# Patient Record
Sex: Female | Born: 1960 | Race: Black or African American | Hispanic: No | Marital: Married | Smoking: Former smoker
Health system: Southern US, Community
[De-identification: ages and names within clinical notes are randomized; demographics above are authoritative.]

## PROBLEM LIST (undated history)

## (undated) DIAGNOSIS — I10 Essential (primary) hypertension: Secondary | ICD-10-CM

## (undated) DIAGNOSIS — R51 Headache: Secondary | ICD-10-CM

## (undated) DIAGNOSIS — T7840XA Allergy, unspecified, initial encounter: Secondary | ICD-10-CM

## (undated) DIAGNOSIS — D649 Anemia, unspecified: Secondary | ICD-10-CM

## (undated) HISTORY — DX: Anemia, unspecified: D64.9

## (undated) HISTORY — DX: Essential (primary) hypertension: I10

## (undated) HISTORY — PX: ABDOMINAL HYSTERECTOMY: SHX81

## (undated) HISTORY — DX: Headache: R51

## (undated) HISTORY — DX: Allergy, unspecified, initial encounter: T78.40XA

---

## 1999-02-10 ENCOUNTER — Emergency Department (HOSPITAL_COMMUNITY): Admission: EM | Admit: 1999-02-10 | Discharge: 1999-02-10 | Payer: Self-pay | Admitting: *Deleted

## 1999-02-13 ENCOUNTER — Encounter (HOSPITAL_COMMUNITY): Admission: RE | Admit: 1999-02-13 | Discharge: 1999-05-14 | Payer: Self-pay | Admitting: Family Medicine

## 1999-03-04 ENCOUNTER — Ambulatory Visit (HOSPITAL_COMMUNITY): Admission: RE | Admit: 1999-03-04 | Discharge: 1999-03-04 | Payer: Self-pay | Admitting: Family Medicine

## 1999-03-04 ENCOUNTER — Encounter: Payer: Self-pay | Admitting: Family Medicine

## 2000-08-10 ENCOUNTER — Encounter: Payer: Self-pay | Admitting: Emergency Medicine

## 2000-08-10 ENCOUNTER — Emergency Department (HOSPITAL_COMMUNITY): Admission: EM | Admit: 2000-08-10 | Discharge: 2000-08-10 | Payer: Self-pay | Admitting: Emergency Medicine

## 2001-03-25 ENCOUNTER — Other Ambulatory Visit: Admission: RE | Admit: 2001-03-25 | Discharge: 2001-03-25 | Payer: Self-pay | Admitting: *Deleted

## 2003-10-25 ENCOUNTER — Ambulatory Visit (HOSPITAL_COMMUNITY): Admission: RE | Admit: 2003-10-25 | Discharge: 2003-10-25 | Payer: Self-pay | Admitting: Obstetrics

## 2003-12-03 ENCOUNTER — Encounter (INDEPENDENT_AMBULATORY_CARE_PROVIDER_SITE_OTHER): Payer: Self-pay | Admitting: Specialist

## 2003-12-03 ENCOUNTER — Inpatient Hospital Stay (HOSPITAL_COMMUNITY): Admission: RE | Admit: 2003-12-03 | Discharge: 2003-12-05 | Payer: Self-pay | Admitting: Obstetrics

## 2005-04-23 ENCOUNTER — Emergency Department (HOSPITAL_COMMUNITY): Admission: EM | Admit: 2005-04-23 | Discharge: 2005-04-23 | Payer: Self-pay | Admitting: Emergency Medicine

## 2005-05-06 ENCOUNTER — Encounter (INDEPENDENT_AMBULATORY_CARE_PROVIDER_SITE_OTHER): Payer: Self-pay | Admitting: Specialist

## 2005-05-06 ENCOUNTER — Encounter: Payer: Self-pay | Admitting: Emergency Medicine

## 2005-05-06 ENCOUNTER — Inpatient Hospital Stay (HOSPITAL_COMMUNITY): Admission: AD | Admit: 2005-05-06 | Discharge: 2005-05-11 | Payer: Self-pay | Admitting: Neurosurgery

## 2005-05-08 ENCOUNTER — Ambulatory Visit: Payer: Self-pay | Admitting: Pulmonary Disease

## 2005-10-02 ENCOUNTER — Ambulatory Visit (HOSPITAL_COMMUNITY): Admission: RE | Admit: 2005-10-02 | Discharge: 2005-10-02 | Payer: Self-pay | Admitting: Obstetrics

## 2006-07-27 LAB — HM MAMMOGRAPHY: HM Mammogram: NORMAL

## 2007-12-21 ENCOUNTER — Emergency Department (HOSPITAL_COMMUNITY): Admission: EM | Admit: 2007-12-21 | Discharge: 2007-12-21 | Payer: Self-pay | Admitting: Emergency Medicine

## 2009-06-26 ENCOUNTER — Ambulatory Visit: Payer: Self-pay | Admitting: Internal Medicine

## 2009-06-26 DIAGNOSIS — F17200 Nicotine dependence, unspecified, uncomplicated: Secondary | ICD-10-CM | POA: Insufficient documentation

## 2009-06-26 DIAGNOSIS — R519 Headache, unspecified: Secondary | ICD-10-CM | POA: Insufficient documentation

## 2009-06-26 DIAGNOSIS — I10 Essential (primary) hypertension: Secondary | ICD-10-CM | POA: Insufficient documentation

## 2009-06-26 DIAGNOSIS — R51 Headache: Secondary | ICD-10-CM

## 2009-06-26 DIAGNOSIS — D649 Anemia, unspecified: Secondary | ICD-10-CM

## 2009-06-26 LAB — CONVERTED CEMR LAB
ALT: 13 units/L (ref 0–35)
AST: 16 units/L (ref 0–37)
Albumin: 4.1 g/dL (ref 3.5–5.2)
Alkaline Phosphatase: 82 units/L (ref 39–117)
BUN: 7 mg/dL (ref 6–23)
Bilirubin, Direct: 0 mg/dL (ref 0.0–0.3)
CO2: 32 meq/L (ref 19–32)
HCT: 43.2 % (ref 36.0–46.0)
Hemoglobin: 14.8 g/dL (ref 12.0–15.0)
Lipase: 25 units/L (ref 11.0–59.0)
MCHC: 34.2 g/dL (ref 30.0–36.0)
MCV: 93.9 fL (ref 78.0–100.0)
RBC: 4.6 M/uL (ref 3.87–5.11)
RDW: 13.1 % (ref 11.5–14.6)
TSH: 1.96 microintl units/mL (ref 0.35–5.50)
Total Protein, Urine: NEGATIVE mg/dL
WBC: 6.1 10*3/uL (ref 4.5–10.5)

## 2009-07-01 ENCOUNTER — Telehealth: Payer: Self-pay | Admitting: Internal Medicine

## 2009-09-11 ENCOUNTER — Telehealth: Payer: Self-pay | Admitting: Internal Medicine

## 2009-10-09 ENCOUNTER — Ambulatory Visit: Payer: Self-pay | Admitting: Internal Medicine

## 2009-10-09 DIAGNOSIS — J309 Allergic rhinitis, unspecified: Secondary | ICD-10-CM

## 2009-12-12 IMAGING — CR DG ABDOMEN ACUTE W/ 1V CHEST
4 series · 4 of 4 positions shown · non-contrast
Comparison: None

CLINICAL DATA: Generalized abdominal pain, constipation and
diarrhea, smoking history

ACUTE ABDOMEN SERIES (ABDOMEN 2 VIEW & CHEST 1 VIEW)

[view not recorded (1 of 4)]
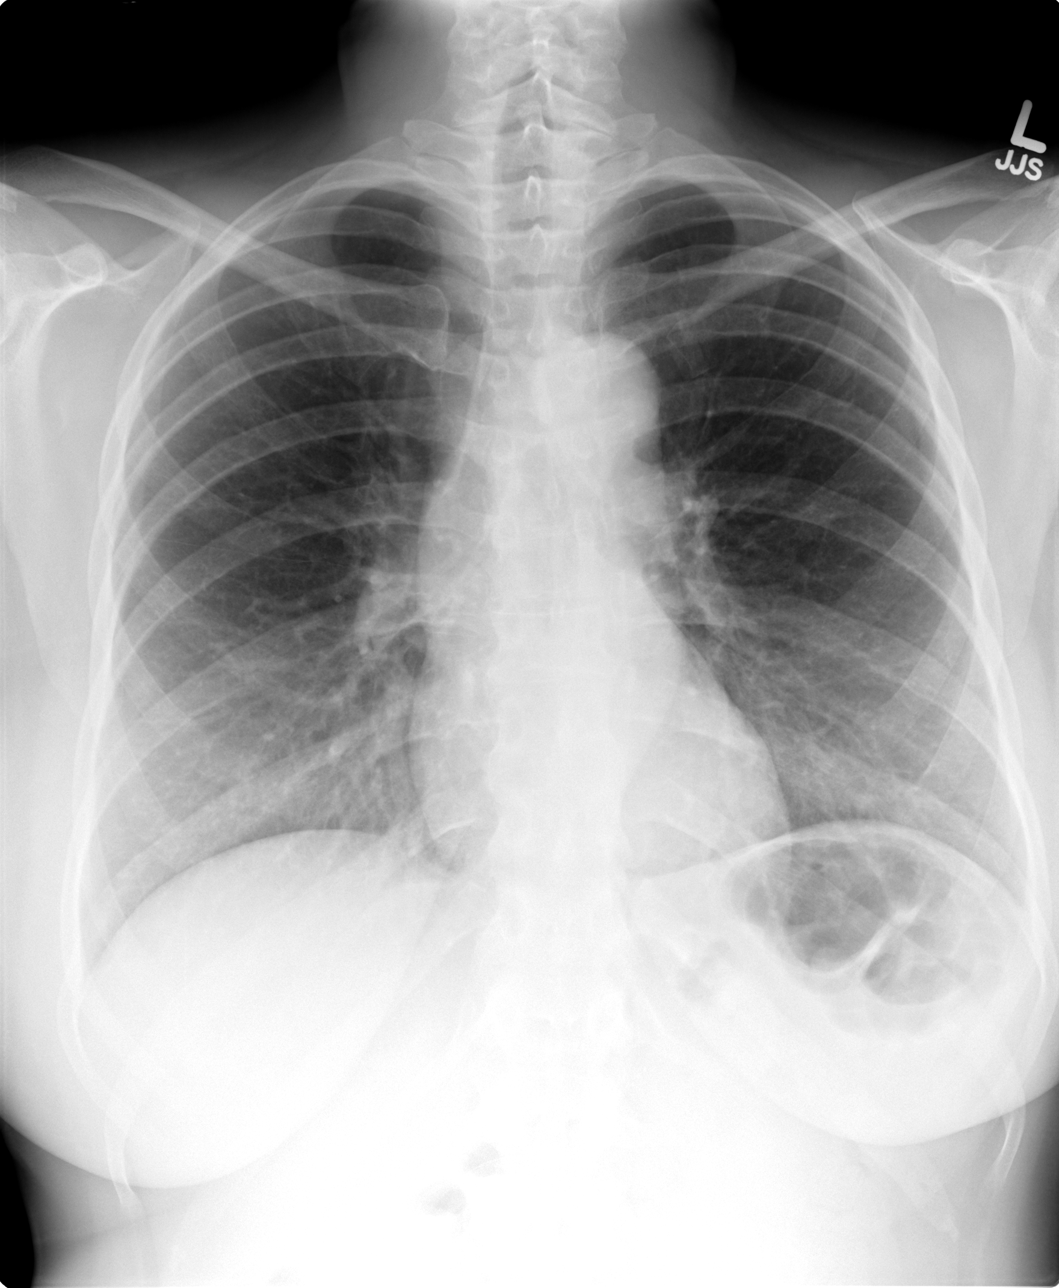

[view not recorded (2 of 4)]
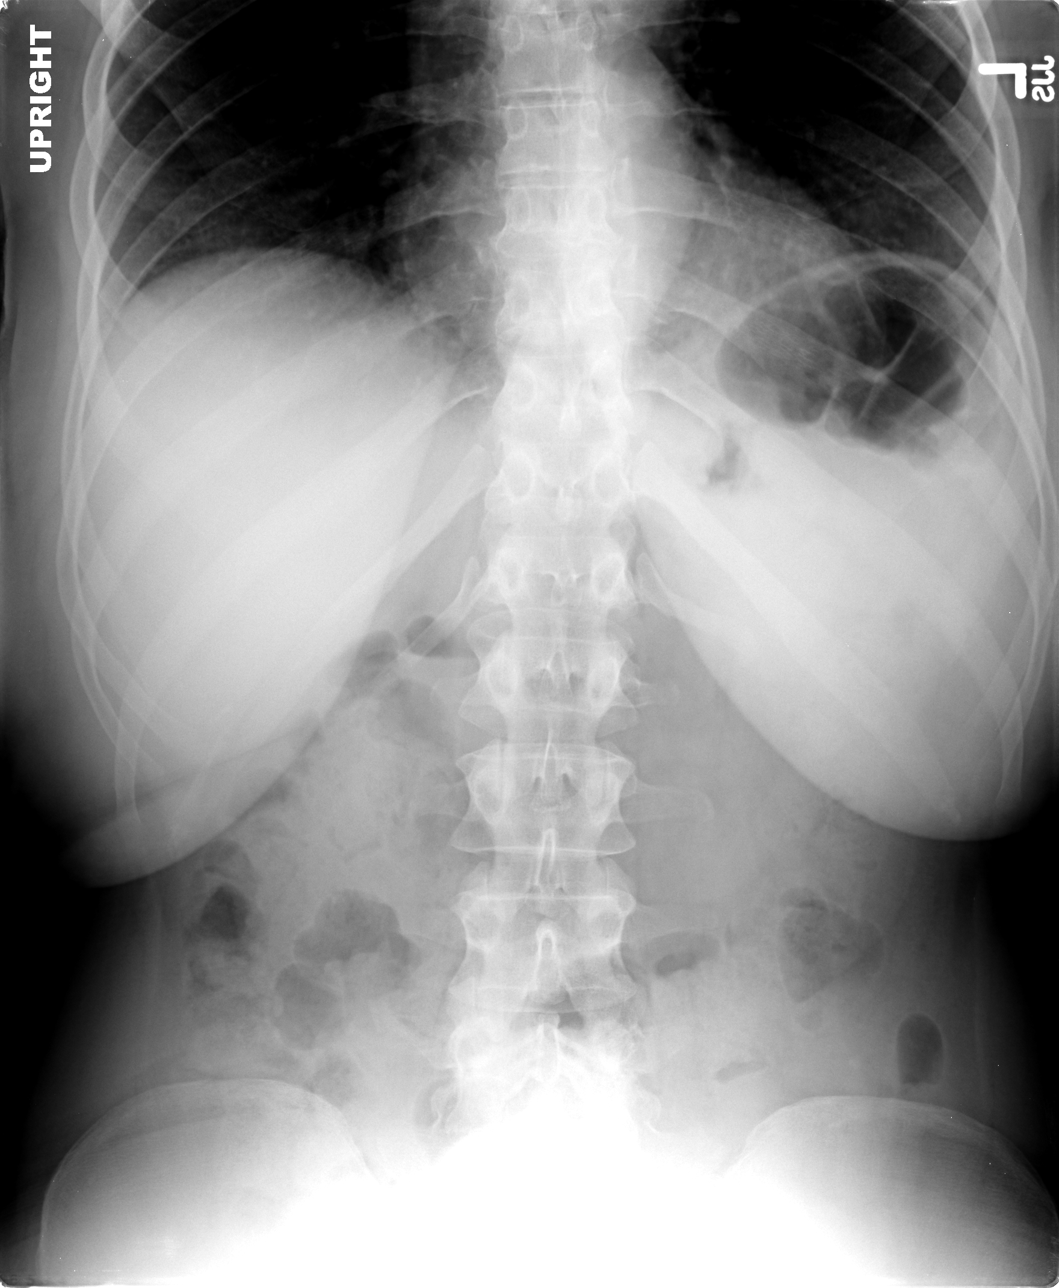

[view not recorded (3 of 4)]
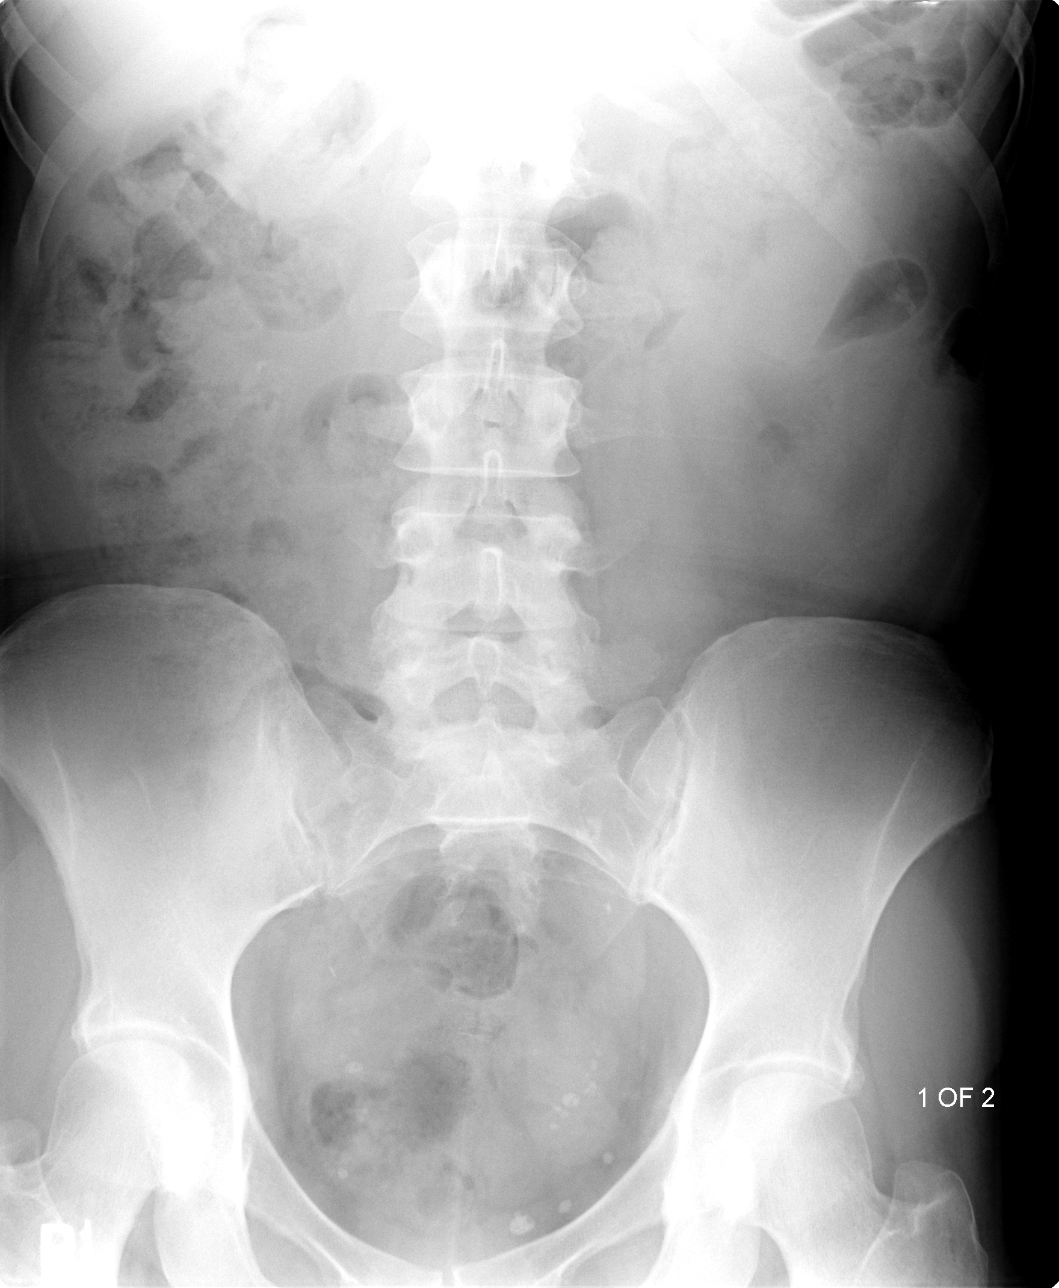

[view not recorded (4 of 4)]
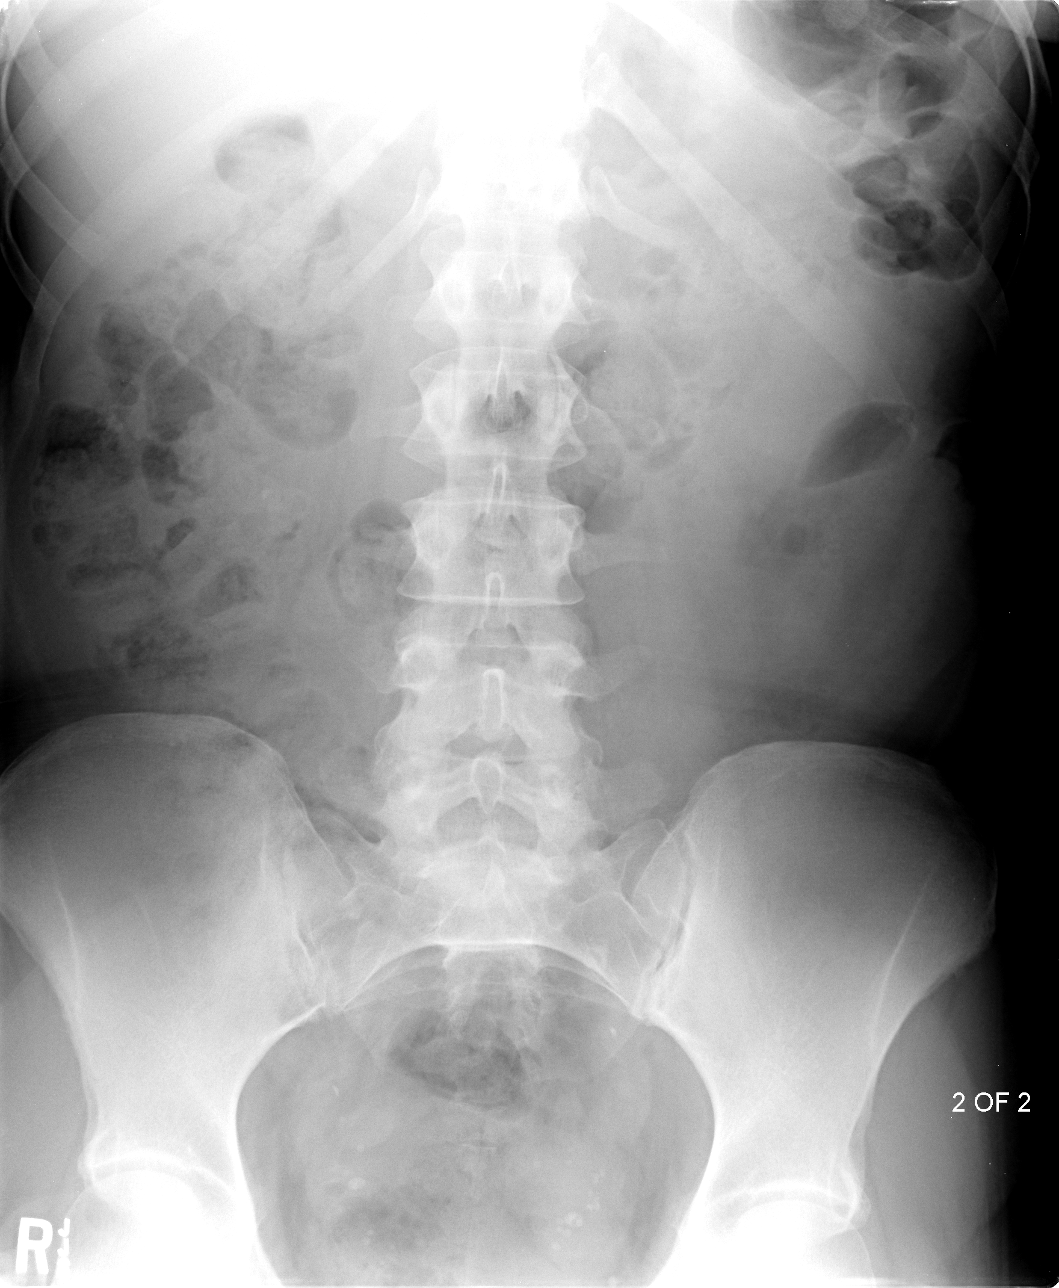

[4 of 4 positions shown; findings below may reference images not displayed]

FINDINGS: The lungs are clear.  The heart is within normal limits
in size.  No adenopathy is seen.  No bony abnormalities noted.

Supine and erect views of the abdomen show a nonspecific bowel gas
pattern.  No obstruction is seen and no free air is noted.  A small
right lower pole renal calculus cannot be excluded  There are
calcifications in the bony pelvis most consistent with phleboliths.
IMPRESSION: No active lung disease.  No bowel obstruction or free air. Cannot
exclude small right renal calculus.

## 2010-04-23 ENCOUNTER — Ambulatory Visit: Payer: Self-pay | Admitting: Internal Medicine

## 2010-04-23 ENCOUNTER — Encounter: Payer: Self-pay | Admitting: Internal Medicine

## 2010-04-23 DIAGNOSIS — R079 Chest pain, unspecified: Secondary | ICD-10-CM

## 2010-04-23 LAB — CONVERTED CEMR LAB
AST: 15 units/L (ref 0–37)
Alkaline Phosphatase: 80 units/L (ref 39–117)
Bilirubin, Direct: 0.2 mg/dL (ref 0.0–0.3)
CK-MB: 0.6 ng/mL (ref 0.3–4.0)
Calcium: 9 mg/dL (ref 8.4–10.5)
Eosinophils Absolute: 0.1 10*3/uL (ref 0.0–0.7)
Eosinophils Relative: 2.2 % (ref 0.0–5.0)
Folate: 7.4 ng/mL
Glucose, Bld: 88 mg/dL (ref 70–99)
HCT: 43.4 % (ref 36.0–46.0)
Hemoglobin: 14.9 g/dL (ref 12.0–15.0)
Iron: 93 ug/dL (ref 42–145)
Lymphocytes Relative: 38.7 % (ref 12.0–46.0)
Neutro Abs: 3.3 10*3/uL (ref 1.4–7.7)
Neutrophils Relative %: 52.5 % (ref 43.0–77.0)
Potassium: 3.9 meq/L (ref 3.5–5.1)
RBC: 4.69 M/uL (ref 3.87–5.11)
Relative Index: 0.9 (ref 0.0–2.5)
Sodium: 140 meq/L (ref 135–145)
TSH: 1.34 microintl units/mL (ref 0.35–5.50)

## 2010-04-28 ENCOUNTER — Telehealth: Payer: Self-pay | Admitting: Internal Medicine

## 2010-08-07 ENCOUNTER — Encounter: Payer: Self-pay | Admitting: Internal Medicine

## 2010-08-16 ENCOUNTER — Encounter: Payer: Self-pay | Admitting: Family Medicine

## 2010-08-26 NOTE — Assessment & Plan Note (Signed)
Summary: FEELING WEAK AND EXHAUSTED--D/T--JOB--STC   Vital Signs:  Patient profile:   50 year old female Height:      69 inches Weight:      178 pounds BMI:     26.38 O2 Sat:      98 % on Room air Temp:     98.6 degrees F oral Pulse rate:   84 / minute Pulse rhythm:   regular Resp:     16 per minute BP sitting:   200 / 130  (left arm) Cuff size:   regular  Vitals Entered By: Lanier Prude, Beverly Gust) (April 23, 2010 3:32 PM)  Nutrition Counseling: Patient's BMI is greater than 25 and therefore counseled on weight management options.  O2 Flow:  Room air CC: f/u c/o fatigue X 2 wks Is Patient Diabetic? No Comments pt needs Rf on Coreg and Tribenzor.  She has been out X 1 weeks   Primary Care Provider:  Etta Grandchild MD  CC:  f/u c/o fatigue X 2 wks.  History of Present Illness:  Hypertension Follow-Up      This is a 50 year old woman who presents for Hypertension follow-up.  The patient reports headaches, but denies lightheadedness, urinary frequency, edema, rash, and fatigue.  Associated symptoms include chest pain.  The patient denies the following associated symptoms: chest pressure, exercise intolerance, dyspnea, palpitations, syncope, leg edema, and pedal edema.  Compliance with medications (by patient report) has been poor.  The patient reports that dietary compliance has been fair.  The patient reports exercising occasionally.  Adjunctive measures currently used by the patient include salt restriction and relaxation.         The patient also complains of Chest pain.  The symptoms began 2 weeks ago.  On a scale of 1 to 10, the intensity is described as a 3.  The patient reports resting chest pain, but denies exertional chest pain, nausea, vomiting, diaphoresis, shortness of breath, palpitations, dizziness, light headedness, syncope, and indigestion.  The pain is described as intermittent and sharp.  The pain is located in the substernal area and the pain does not  radiate.  Episodes of chest pain last 1-2 minutes.  The pain is brought on or made worse by coughing.    Preventive Screening-Counseling & Management  Alcohol-Tobacco     Alcohol drinks/day: <1     Alcohol type: wine     >5/day in last 3 mos: no     Alcohol Counseling: not indicated; use of alcohol is not excessive or problematic     Feels need to cut down: no     Feels annoyed by complaints: no     Feels guilty re: drinking: no     Needs 'eye opener' in am: no     Smoking Status: current     Smoking Cessation Counseling: yes     Smoke Cessation Stage: precontemplative     Packs/Day: 0.75     Year Started: 1990     Pack years: 15     Tobacco Counseling: to quit use of tobacco products  Current Medications (verified): 1)  Coreg 12.5 Mg Tabs (Carvedilol) .... Take 1 Tablet By Mouth Two Times A Day 2)  Miralax  Pack (Polyethylene Glycol 3350) .... One in 8 Ounces of Juice Once Daily As Needed For Constipation 3)  Tribenzor 40-5-12.5 Mg Tabs (Olmesartan-Amlodipine-Hctz) .... One By Mouth Once Daily For High Blood Pressure 4)  Veramyst 27.5 Mcg/spray Susp (Fluticasone Furoate) .... 2 Puffs Each Nostril  Once Daily  Allergies (verified): 1)  ! Norvasc  Past History:  Past Medical History: Last updated: 10/09/2009 Headache-pit. hemorrhage in 2008 Hypertension-followed by [facility] Anemia-NOS Allergic rhinitis  Past Surgical History: Last updated: 06/26/2009 Hysterectomy  Family History: Last updated: 06/26/2009 Family History Hypertension  Social History: Last updated: 06/26/2009 Occupation: Scientist, product/process development support specialist Married Current Smoker Alcohol use-yes Drug use-no Regular exercise-no  Risk Factors: Alcohol Use: <1 (04/23/2010) >5 drinks/d w/in last 3 months: no (04/23/2010) Exercise: no (10/09/2009)  Risk Factors: Smoking Status: current (04/23/2010) Packs/Day: 0.75 (04/23/2010)  Family History: Reviewed history from 06/26/2009 and no changes  required. Family History Hypertension  Social History: Reviewed history from 06/26/2009 and no changes required. Occupation: Investment banker, operational Married Current Smoker Alcohol use-yes Drug use-no Regular exercise-no  Review of Systems       The patient complains of weight gain and chest pain.  The patient denies anorexia, fever, weight loss, decreased hearing, hoarseness, syncope, dyspnea on exertion, peripheral edema, prolonged cough, hemoptysis, abdominal pain, hematuria, suspicious skin lesions, transient blindness, difficulty walking, depression, enlarged lymph nodes, and angioedema.    Physical Exam  General:  alert, well-developed, well-nourished, well-hydrated, appropriate dress, normal appearance, healthy-appearing, cooperative to examination, and good hygiene.   Head:  normocephalic, atraumatic, no abnormalities observed, and no abnormalities palpated.   Eyes:  vision grossly intact, pupils equal, pupils round, pupils reactive to light, and a-v nicking.   Mouth:  Oral mucosa and oropharynx without lesions or exudates.  Teeth in good repair. Neck:  supple, full ROM, no masses, no thyromegaly, no thyroid nodules or tenderness, no JVD, normal carotid upstroke, no carotid bruits, no cervical lymphadenopathy, and no neck tenderness.   Lungs:  normal respiratory effort, no intercostal retractions, no accessory muscle use, normal breath sounds, no dullness, no fremitus, no crackles, and no wheezes.   Heart:  normal rate, regular rhythm, no murmur, no gallop, no rub, and no JVD.   Abdomen:  soft, non-tender, normal bowel sounds, no distention, no masses, no guarding, no rigidity, no rebound tenderness, no abdominal hernia, no inguinal hernia, no hepatomegaly, and no splenomegaly.   Msk:  normal ROM, no joint tenderness, no joint swelling, no joint warmth, no redness over joints, no joint deformities, no joint instability, no crepitation, and no muscle atrophy.   Pulses:  R and L  carotid,radial,femoral,dorsalis pedis and posterior tibial pulses are full and equal bilaterally Extremities:  No clubbing, cyanosis, edema, or deformity noted with normal full range of motion of all joints.   Neurologic:  No cranial nerve deficits noted. Station and gait are normal. Plantar reflexes are down-going bilaterally. DTRs are symmetrical throughout. Sensory, motor and coordinative functions appear intact. Skin:  turgor normal, color normal, no rashes, no suspicious lesions, no ecchymoses, no petechiae, no purpura, no ulcerations, and no edema.   Cervical Nodes:  no anterior cervical adenopathy and no posterior cervical adenopathy.   Axillary Nodes:  no R axillary adenopathy and no L axillary adenopathy.   Inguinal Nodes:  no R inguinal adenopathy and no L inguinal adenopathy.   Psych:  Cognition and judgment appear intact. Alert and cooperative with normal attention span and concentration. No apparent delusions, illusions, hallucinations Additional Exam:  EKG shows a slight loss of voltage in V1 and V2 but there are no Q waves and acute ST/T wave changes   Impression & Recommendations:  Problem # 1:  CHEST PAIN (ICD-786.50) Assessment New this is atypical and her EKG is benign, I will restart her BP  meds and look at labs Orders: Venipuncture (60454) TLB-Cardiac Panel ([id number]-GNFA) TLB-BMP (Basic Metabolic Panel-BMET) (80048-METABOL) TLB-CBC Platelet - w/Differential (85025-CBCD) TLB-Hepatic/Liver Function Pnl (80076-HEPATIC) TLB-TSH (Thyroid Stimulating Hormone) (84443-TSH) EKG w/ Interpretation (93000)  Problem # 2:  TOBACCO USE (ICD-305.1) Assessment: Unchanged  Orders: Tobacco use cessation intermediate 3-10 minutes (21308)  Encouraged smoking cessation and discussed different methods for smoking cessation.   Problem # 3:  HYPERTENSION (ICD-401.9) Assessment: Deteriorated  Her updated medication list for this problem includes:    Coreg 12.5 Mg Tabs (Carvedilol)  .Marland Kitchen... Take 1 tablet by mouth two times a day    Tribenzor 40-5-12.5 Mg Tabs (Olmesartan-amlodipine-hctz) ..... One by mouth once daily for high blood pressure  Orders: Venipuncture (65784) TLB-Cardiac Panel ([id number]) TLB-BMP (Basic Metabolic Panel-BMET) (80048-METABOL) TLB-CBC Platelet - w/Differential (85025-CBCD) TLB-Hepatic/Liver Function Pnl (80076-HEPATIC) TLB-TSH (Thyroid Stimulating Hormone) (84443-TSH) Tobacco use cessation intermediate 3-10 minutes (99406)  BP today: 200/130 Prior BP: 150/100 (10/09/2009)  Prior 10 Yr Risk Heart Disease: Not enough information (06/26/2009)  Labs Reviewed: K+: 3.5 (06/26/2009) Creat: : 0.7 (06/26/2009)     Problem # 4:  ANEMIA-NOS (ICD-285.9) Assessment: Unchanged  Orders: Venipuncture (01027) TLB-Cardiac Panel ([id number]) TLB-BMP (Basic Metabolic Panel-BMET) (80048-METABOL) TLB-CBC Platelet - w/Differential (85025-CBCD) TLB-Hepatic/Liver Function Pnl (80076-HEPATIC) TLB-TSH (Thyroid Stimulating Hormone) (84443-TSH) TLB-B12 + Folate Pnl (95638_75643-P29/JJO) TLB-IBC Pnl (Iron/FE;Transferrin) (83550-IBC)  Complete Medication List: 1)  Coreg 12.5 Mg Tabs (Carvedilol) .... Take 1 tablet by mouth two times a day 2)  Miralax Pack (Polyethylene glycol 3350) .... One in 8 ounces of juice once daily as needed for constipation 3)  Tribenzor 40-5-12.5 Mg Tabs (Olmesartan-amlodipine-hctz) .... One by mouth once daily for high blood pressure 4)  Veramyst 27.5 Mcg/spray Susp (Fluticasone furoate) .... 2 puffs each nostril once daily  Patient Instructions: 1)  Please schedule a follow-up appointment in 2 weeks. 2)  Tobacco is very bad for your health and your loved ones! You Should stop smoking!. 3)  Stop Smoking Tips: Choose a Quit date. Cut down before the Quit date. decide what you will do as a substitute when you feel the urge to smoke(gum,toothpick,exercise). 4)  It is important that you exercise regularly at least 20  minutes 5 times a week. If you develop chest pain, have severe difficulty breathing, or feel very tired , stop exercising immediately and seek medical attention. 5)  You need to lose weight. Consider a lower calorie diet and regular exercise.  6)  Check your Blood Pressure regularly. If it is above 140/90: you should make an appointment. Prescriptions: TRIBENZOR 40-5-12.5 MG TABS (OLMESARTAN-AMLODIPINE-HCTZ) One by mouth once daily for high blood pressure  #112 x 0   Entered and Authorized by:   Etta Grandchild MD   Signed by:   Etta Grandchild MD on 04/23/2010   Method used:   Samples Given   RxID:   [id number]

## 2010-08-26 NOTE — Progress Notes (Signed)
Summary: Benicar PA  Phone Note From Pharmacy   Summary of Call: Rec'd PA request for Benicar. The preferred alternatives are Losartan, Diovan, and Micardis. Please advise. Initial call taken by: Lucious Groves,  September 11, 2009 9:38 AM  Follow-up for Phone Call        done Follow-up by: Etta Grandchild MD,  September 11, 2009 9:41 AM  Additional Follow-up for Phone Call Additional follow up Details #1::        Patient notified and states that Micardis previously did not help her blood pressure. Additional Follow-up by: Lucious Groves,  September 11, 2009 10:20 AM    New/Updated Medications: DIOVAN HCT 160-12.5 MG TABS (VALSARTAN-HYDROCHLOROTHIAZIDE) one by mouth once daily for high blood pressure Prescriptions: DIOVAN HCT 160-12.5 MG TABS (VALSARTAN-HYDROCHLOROTHIAZIDE) one by mouth once daily for high blood pressure  #30 x 11   Entered and Authorized by:   Etta Grandchild MD   Signed by:   Etta Grandchild MD on 09/11/2009   Method used:   Electronically to        [facility]  [address] [id number] (retail)       [address]       [place], [place]  [address]       Ph: [phone] or [phone]       Fax: [phone]   RxID:   [id number]

## 2010-08-26 NOTE — Progress Notes (Signed)
Summary: RESULTS  Phone Note Call from Patient Call back at [phone]   Summary of Call: Patient is requesting results of labs. She continues to feel very fatigued, what does MD advise?  Initial call taken by: Lamar Sprinkles, CMA,  April 28, 2010 4:17 PM  Follow-up for Phone Call        labs are normal, follow up Follow-up by: Etta Grandchild MD,  April 28, 2010 4:19 PM  Additional Follow-up for Phone Call Additional follow up Details #1::        left mess to call office back..............Marland KitchenLamar Sprinkles, CMA  April 28, 2010 5:17 PM     Additional Follow-up for Phone Call Additional follow up Details #2::    lm for pt to call back Follow-up by: Ami Bullins CMA,  April 30, 2010 8:13 AM  Additional Follow-up for Phone Call Additional follow up Details #3:: Details for Additional Follow-up Action Taken: No answer, no vm...................Marland KitchenLamar Sprinkles, CMA  April 30, 2010 5:40 PM   Pt states that feelings of fatigue have been improving since she called. Pt says she will continue to monitor improvemnet and if needed she will call back to sch f/u appt with TLJ Additional Follow-up by: Margaret Pyle, CMA,  May 01, 2010 9:49 AM

## 2010-08-26 NOTE — Progress Notes (Signed)
Summary: Diovan alternative  Phone Note Call from Patient   Summary of Call: Patient was told the preferred alternatives for her ins are Benazepril, Lisinopril, Quinapril, and Losartan. Which one would you like to use since the patient cannot get Diovan. Please advise. Initial call taken by: Lucious Groves,  September 11, 2009 11:47 AM  Follow-up for Phone Call        done Follow-up by: Etta Grandchild MD,  September 11, 2009 12:44 PM    New/Updated Medications: LOSARTAN POTASSIUM-HCTZ 100-12.5 MG TABS (LOSARTAN POTASSIUM-HCTZ) one by mouth once daily for high blood pressure Prescriptions: LOSARTAN POTASSIUM-HCTZ 100-12.5 MG TABS (LOSARTAN POTASSIUM-HCTZ) one by mouth once daily for high blood pressure  #30 x 11   Entered and Authorized by:   Etta Grandchild MD   Signed by:   Etta Grandchild MD on 09/11/2009   Method used:   [facility] to        [facility]  [address] [id number]       [address]       [place], [place]  [address]       Ph: [phone] or [phone]       Fax: [phone]   RxID:   [id number]   Appended Document: Diovan alternative Patient notified.

## 2010-08-26 NOTE — Assessment & Plan Note (Signed)
Summary: f/u on blood pressure/#/Cd   Vital Signs:  Patient profile:   50 year old female Height:      69 inches Weight:      176 pounds BMI:     26.08 O2 Sat:      98 % on Room air Temp:     99.2 degrees F oral Pulse rate:   77 / minute Pulse rhythm:   regular Resp:     16 per minute BP sitting:   150 / 100  (left arm) Cuff size:   large  Vitals Entered By: Rock Nephew CMA (October 09, 2009 2:28 PM)  Nutrition Counseling: Patient's BMI is greater than 25 and therefore counseled on weight management options.  O2 Flow:  Room air CC: follow up BP/ sinus pressure and headache, Hypertension Management Is Patient Diabetic? No   Primary Care Provider:  Etta Grandchild MD  CC:  follow up BP/ sinus pressure and headache and Hypertension Management.  History of Present Illness: She returns for f/up and she requests a new med for htn. Her insurance co. made her switch to losartan/hctz and it does not control her BP.  Also, she has seasonal allergies.  Hypertension History:      She denies headache, chest pain, palpitations, dyspnea with exertion, orthopnea, PND, peripheral edema, visual symptoms, neurologic problems, syncope, and side effects from treatment.  She notes no problems with any antihypertensive medication side effects.        Positive major cardiovascular risk factors include hypertension and current tobacco user.  Negative major cardiovascular risk factors include female age less than 79 years old, no history of diabetes or hyperlipidemia, and negative family history for ischemic heart disease.        Further assessment for target organ damage reveals no history of ASHD, cardiac end-organ damage (CHF/LVH), stroke/TIA, peripheral vascular disease, renal insufficiency, or hypertensive retinopathy.     Preventive Screening-Counseling & Management  Alcohol-Tobacco     Alcohol drinks/day: <1     Alcohol type: wine     >5/day in last 3 mos: no     Alcohol Counseling: not  indicated; use of alcohol is not excessive or problematic     Feels need to cut down: no     Feels annoyed by complaints: no     Feels guilty re: drinking: no     Needs 'eye opener' in am: no     Smoking Status: current     Smoking Cessation Counseling: yes     Smoke Cessation Stage: precontemplative     Packs/Day: 0.75     Year Started: 1990     Pack years: 52  Caffeine-Diet-Exercise     Does Patient Exercise: no  Hep-HIV-STD-Contraception     Hepatitis Risk: no risk noted     HIV Risk: no risk noted     STD Risk: no risk noted  Clinical Review Panels:  CBC   WBC:  6.1 (06/26/2009)   RBC:  4.60 (06/26/2009)   Hgb:  14.8 (06/26/2009)   Hct:  43.2 (06/26/2009)   Platelets:  240.0 (06/26/2009)   MCV  93.9 (06/26/2009)   MCHC  34.2 (06/26/2009)   RDW  13.1 (06/26/2009)   PMN:  56.3 (06/26/2009)   Lymphs:  36.2 (06/26/2009)   Monos:  4.5 (06/26/2009)   Eosinophils:  2.0 (06/26/2009)   Basophil:  1.0 (06/26/2009)  Complete Metabolic Panel   Glucose:  98 (06/26/2009)   Sodium:  142 (06/26/2009)   Potassium:  3.5 (  06/26/2009)   Chloride:  102 (06/26/2009)   CO2:  32 (06/26/2009)   BUN:  7 (06/26/2009)   Creatinine:  0.7 (06/26/2009)   Albumin:  4.1 (06/26/2009)   Total Protein:  7.8 (06/26/2009)   Calcium:  9.4 (06/26/2009)   Total Bili:  0.7 (06/26/2009)   Alk Phos:  82 (06/26/2009)   SGPT (ALT):  13 (06/26/2009)   SGOT (AST):  16 (06/26/2009)   Medications Prior to Update: 1)  Coreg 12.5 Mg Tabs (Carvedilol) .... Take 1 Tablet By Mouth Two Times A Day 2)  Losartan Potassium-Hctz 100-12.5 Mg Tabs (Losartan Potassium-Hctz) .... One By Mouth Once Daily For High Blood Pressure 3)  Miralax  Pack (Polyethylene Glycol 3350) .... One in 8 Ounces of Juice Once Daily As Needed For Constipation  Current Medications (verified): 1)  Coreg 12.5 Mg Tabs (Carvedilol) .... Take 1 Tablet By Mouth Two Times A Day 2)  Miralax  Pack (Polyethylene Glycol 3350) .... One in 8 Ounces  of Juice Once Daily As Needed For Constipation 3)  Tribenzor 40-5-12.5 Mg Tabs (Olmesartan-Amlodipine-Hctz) .... One By Mouth Once Daily For High Blood Pressure 4)  Veramyst 27.5 Mcg/spray Susp (Fluticasone Furoate) .... 2 Puffs Each Nostril Once Daily  Allergies (verified): 1)  ! Norvasc  Past History:  Past Medical History: Headache-pit. hemorrhage in 2008 Hypertension-followed by [facility] Anemia-NOS Allergic rhinitis  Past Surgical History: Reviewed history from 06/26/2009 and no changes required. Hysterectomy  Family History: Reviewed history from 06/26/2009 and no changes required. Family History Hypertension  Social History: Reviewed history from 06/26/2009 and no changes required. Occupation: Investment banker, operational Married Current Smoker Alcohol use-yes Drug use-no Regular exercise-no  Review of Systems ENT:  Complains of nasal congestion and nosebleeds; denies decreased hearing, difficulty swallowing, ear discharge, earache, hoarseness, postnasal drainage, ringing in ears, sinus pressure, and sore throat.  Physical Exam  General:  alert, well-developed, well-nourished, well-hydrated, appropriate dress, normal appearance, healthy-appearing, cooperative to examination, and good hygiene.   Head:  normocephalic, atraumatic, and no abnormalities observed.   Eyes:  vision grossly intact, pupils equal, and pupils round.  a-v nicking.   Ears:  R ear normal and L ear normal.   Nose:  no external deformity, no airflow obstruction, no intranasal foreign body, no nasal polyps, no nasal mucosal lesions, no mucosal friability, no active bleeding or clots, no sinus percussion tenderness, no septum abnormalities, nasal dischargemucosal pallor, and mucosal edema.   Mouth:  Oral mucosa and oropharynx without lesions or exudates.  Teeth in good repair. Neck:  supple, full ROM, no masses, no thyroid nodules or tenderness, no JVD, no cervical lymphadenopathy, and no neck  tenderness.   Lungs:  normal respiratory effort, no intercostal retractions, no accessory muscle use, normal breath sounds, no dullness, no fremitus, no crackles, and no wheezes.   Heart:  normal rate, regular rhythm, no murmur, no gallop, no rub, and no JVD.   Abdomen:  soft, non-tender, no distention, no masses, no guarding, no rigidity, no rebound tenderness, no abdominal hernia, no inguinal hernia, no hepatomegaly, no splenomegaly, abdominal scar(s), and bowel sounds hypoactive.   Msk:  normal ROM, no joint tenderness, no joint swelling, no joint warmth, and no redness over joints.   Pulses:  R and L carotid,radial,femoral,dorsalis pedis and posterior tibial pulses are full and equal bilaterally Extremities:  No clubbing, cyanosis, edema, or deformity noted with normal full range of motion of all joints.   Neurologic:  No cranial nerve deficits noted. Station and gait are  normal. Plantar reflexes are down-going bilaterally. DTRs are symmetrical throughout. Sensory, motor and coordinative functions appear intact. Skin:  turgor normal, color normal, no rashes, no suspicious lesions, no ecchymoses, no petechiae, no purpura, no ulcerations, and no edema.   Cervical Nodes:  no anterior cervical adenopathy and no posterior cervical adenopathy.   Psych:  Cognition and judgment appear intact. Alert and cooperative with normal attention span and concentration. No apparent delusions, illusions, hallucinations   Impression & Recommendations:  Problem # 1:  ALLERGIC RHINITIS (ICD-477.9) Assessment New  Her updated medication list for this problem includes:    Veramyst 27.5 Mcg/spray Susp (Fluticasone furoate) .Marland Kitchen... 2 puffs each nostril once daily  Discussed use of allergy medications and environmental measures.   Orders: Tobacco use cessation intermediate 3-10 minutes (16109)  Problem # 2:  HYPERTENSION (ICD-401.9) Assessment: Deteriorated  The following medications were removed from the  medication list:    Losartan Potassium-hctz 100-12.5 Mg Tabs (Losartan potassium-hctz) ..... One by mouth once daily for high blood pressure Her updated medication list for this problem includes:    Coreg 12.5 Mg Tabs (Carvedilol) .Marland Kitchen... Take 1 tablet by mouth two times a day    Tribenzor 40-5-12.5 Mg Tabs (Olmesartan-amlodipine-hctz) ..... One by mouth once daily for high blood pressure  BP today: 150/100 Prior BP: 132/90 (06/26/2009)  Prior 10 Yr Risk Heart Disease: Not enough information (06/26/2009)  Labs Reviewed: K+: 3.5 (06/26/2009) Creat: : 0.7 (06/26/2009)     Orders: Tobacco use cessation intermediate 3-10 minutes (60454)  Problem # 3:  TOBACCO USE (ICD-305.1) Assessment: Unchanged  Encouraged smoking cessation and discussed different methods for smoking cessation.   Orders: Tobacco use cessation intermediate 3-10 minutes (99406)  Problem # 4:  HEADACHE (ICD-784.0) Assessment: Improved  Her updated medication list for this problem includes:    Coreg 12.5 Mg Tabs (Carvedilol) .Marland Kitchen... Take 1 tablet by mouth two times a day  Orders: Tobacco use cessation intermediate 3-10 minutes (99406)  Complete Medication List: 1)  Coreg 12.5 Mg Tabs (Carvedilol) .... Take 1 tablet by mouth two times a day 2)  Miralax Pack (Polyethylene glycol 3350) .... One in 8 ounces of juice once daily as needed for constipation 3)  Tribenzor 40-5-12.5 Mg Tabs (Olmesartan-amlodipine-hctz) .... One by mouth once daily for high blood pressure 4)  Veramyst 27.5 Mcg/spray Susp (Fluticasone furoate) .... 2 puffs each nostril once daily  Hypertension Assessment/Plan:      The patient's hypertensive risk group is category B: At least one risk factor (excluding diabetes) with no target organ damage.  Today's blood pressure is 150/100.  Her blood pressure goal is < 140/90.  Patient Instructions: 1)  Please schedule a follow-up appointment in 1 month. 2)  It is important that you exercise regularly at  least 20 minutes 5 times a week. If you develop chest pain, have severe difficulty breathing, or feel very tired , stop exercising immediately and seek medical attention. 3)  Check your Blood Pressure regularly. If it is above 140/90: you should make an appointment. 4)  Tobacco is very bad for your health and your loved ones! You Should stop smoking!. 5)  Stop Smoking Tips: Choose a Quit date. Cut down before the Quit date. decide what you will do as a substitute when you feel the urge to smoke(gum,toothpick,exercise). Prescriptions: VERAMYST 27.5 MCG/SPRAY SUSP (FLUTICASONE FUROATE) 2 puffs each nostril once daily  #5 inhs x 0   Entered and Authorized by:   Etta Grandchild MD   Signed  by:   Etta Grandchild MD on 10/09/2009   Method used:   Samples Given   RxID:   [id number] TRIBENZOR 40-5-12.5 MG TABS (OLMESARTAN-AMLODIPINE-HCTZ) One by mouth once daily for high blood pressure  #140 x 0   Entered and Authorized by:   Etta Grandchild MD   Signed by:   Etta Grandchild MD on 10/09/2009   Method used:   Samples Given   RxID:   [id number]

## 2010-08-28 NOTE — Letter (Signed)
Summary: [facility]   Imported By: Lester [place] 08/18/2010 07:39:17  _____________________________________________________________________  External Attachment:    Type:   Image     Comment:   External Document

## 2010-12-12 NOTE — H&P (Signed)
NAMEMarland Kitchen  GISSELE, NARDUCCI                ACCOUNT NO.:  [id number]   MEDICAL RECORD NO.:  [id number]           PATIENT TYPE:   LOCATION:                                 FACILITY:   PHYSICIAN:  Danae Orleans. Venetia Maxon, M.D.       DATE OF BIRTH:   DATE OF ADMISSION:  DATE OF DISCHARGE:                                HISTORY & PHYSICAL   PREOPERATIVE DIAGNOSES:  Pituitary apoplexy with cranial neuropathy.   REASON FOR ADMISSION:  Pituitary apoplexy.   HISTORY OF ILLNESS:  Tonnie Stillman is a 50 year old right-handed woman with a  four-day history of severe headache and vomiting with progressive complaint  of blurring of her left eye vision and facial numbness.  She went to the  [facility] Emergency Room this morning where head CT was obtained  consistent with pituitary hemorrhage and pituitary mass.  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  The tumor appears to  invade the cavernous sinus and measures 16 x 12 x 10 mm.  Past medical  history is significant for hypertension.  She has no known drug allergies.   MEDICATIONS:  Micardis and Coreg.   PHYSICAL EXAMINATION:  VITAL SIGNS:  Initial blood pressure was 190/98.  GENERAL:  She is an uncomfortable African-American female.  HEENT:  She has ptosis of her left lid.  She is able to open her eyes with  encouragement, but still has lid lag bilaterally left worse than right.  She  complains of blurred vision in her left eye.  Pupils are equal, round, and  reactive to light.  Extraocular movements are intact.  Fields appear to be  full, although she complains of significant blurring of her vision.  She  also has V2 and V3 distribution dysesthesia over her face.  Facial motor is  symmetric.  She has no pronator drift.  She moves all extremities well with  full power.  Has no numbness in her upper or lower extremities.  CHEST:  Clear to  auscultation.  HEART:  Regular rate and rhythm without murmur.  ABDOMEN:  Soft and nontender.  Active bowel sounds.  Obese.  EXTREMITIES:  No edema, clubbing, or cyanosis in the lower extremities.   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  She  appears ill.  Plan is for emergent __________ resection of hemorrhagic  pituitary mass.  Discussed this with the patient and patient's husband who  wished to proceed.  Dr. Ezzard Standing from ENT is to perform the approach.      Danae Orleans. Venetia Maxon, M.D.  Electronically Signed    JDS/MEDQ  D:  05/06/2005  T:  05/06/2005  Job:  [id number]

## 2010-12-12 NOTE — Op Note (Signed)
NAMEMADESYN, AST                ACCOUNT NO.:  [id number]   MEDICAL RECORD NO.:  [id number]          PATIENT TYPE:  INP   LOCATION:  3109                         FACILITY:  [facility]   PHYSICIAN:  Kristine Garbe. Ezzard Standing, M.D.DATE OF BIRTH:  1960-11-15   DATE OF PROCEDURE:  05/06/2005  DATE OF DISCHARGE:                                 OPERATIVE REPORT   PREOPERATIVE DIAGNOSIS:  Pituitary tumor with pituitary apoplexy.   POSTOPERATIVE DIAGNOSIS:  Pituitary tumor with pituitary apoplexy.   OPERATION PERFORMED:  Septal transsphenoidal resection of pituitary tumor.   CO SURGEON:  Kristine Garbe. Ezzard Standing, M.D.   CO SURGEON:  Danae Orleans. Venetia Maxon, M.D.   ANESTHESIA:  General endotracheal.   ESTIMATED BLOOD LOSS:  The estimated blood loss was 50 mL.   COMPLICATIONS:  There were no complications,   BRIEF CLINICAL NOTE:  Cynthia Hendrix is a 50 year old black female who has had  a headache now for about three of four days with some early visual changes  and some numbness in the face.  On MRI scan at the [facility] emergency  room she appeared to have a hemorrhage into a pituitary tumor; and, she is  brought to the operating room emergently for septal transsphenoidal  resection of the pituitary tumor by Dr. Venetia Maxon and Dr. Ezzard Standing.   DESCRIPTION OF OPERATION:  The patient was brought to the operating room  from [facility] emergency room and underwent general endotracheal  anesthesia, which was positioned by Dr. Venetia Maxon.  The nose was then prepped  with Betadine solution and draped out with sterile towels.  The nose was  folded and prepped with a cotton pledget soaked in Afrin, and the septum was  injected with Xylocaine with epinephrine for hemostasis.   An extended hemitransfixion incision was made along the caudal edge of the  septum on the right side.  Mucoperichondrial __________  flaps were elevated  posteriorly and extended along the folded nose and over the maxillary crest.  The anterior  2-2.5 cm of cartilaginous septum was preserved.  This was  transected vertically about 2.5-3 cm posteriorly and the anterior  cartilaginous septum was dissected off the maxillary crest and pushed it  over into the left nasal airway.  Posteriorly mucoperichondrial was placed  across the flaps other  than bilaterally off the posterior of the septum,  and the septum was resected.  Portion were preserved for closure by Dr.  Venetia Maxon.   The face of the sphenoid sinus was exposed and the [facility] retractor was  placed.  The sphenoid sinus was then entered and enlarged with Kerrison  forceps.  The mucosa was stripped out both the left and right sphenoid  sinus.  The septum, started on the left side, but we went back to the  midline.  After exposing the sella through the sphenoid sinus Dr. Venetia Maxon came  in an opened up the sella to  and we resected the pituitary tumor.   I was then called back in for closure.  Dr. Venetia Maxon had harvested some fat and  packed the sphenoid sinuses.  The septum was brought  back to the midline on  he maxillary crest.  Hemitransfixion incision was closed with interrupted 4-  0 chromic sutures.  The septum was then basted with a 3-0 chromic suture.  Splints were secured on either side of the septum with 3-0 nylon suture.  The nose was then packed with telfa soaked in bacitracin ointment placed  bilaterally.   This completed the procedure.  Asha was awakened from anesthesia and  transferred to the recovery room postoperatively doing well.   DISPOSITION:  The patient will be transferred to the neurological intensive  care unit.  We will plan on removing her nasal packs in two to three days  with subsequent discharge to home.  I will have her follow up in [place] office  in seen to 10 days for recheck and removal of the __________  splints.           ______________________________  Kristine Garbe. Ezzard Standing, M.D.     CEN/MEDQ  D:  05/06/2005  T:  05/07/2005  Job:  [id number]   cc:    Danae Orleans. Venetia Maxon, M.D.  Fax: [phone]

## 2010-12-12 NOTE — Op Note (Signed)
NAMESHELMA, [name] NO.:  [id number]   MEDICAL RECORD NO.:  [id number]          PATIENT TYPE:  INP   LOCATION:  3109                         FACILITY:  [facility]   PHYSICIAN:  Danae Orleans. Venetia Maxon, M.D.  DATE OF BIRTH:  20-Dec-1960   DATE OF PROCEDURE:  05/06/2005  DATE OF DISCHARGE:                                 OPERATIVE REPORT   PREOPERATIVE DIAGNOSIS:  Pituitary apoplexy with cranial neuropathy.   POSTOPERATIVE DIAGNOSIS:  Pituitary apoplexy with cranial neuropathy.   OPERATION PERFORMED:  Transnodal resection of hemorrhagic pituitary  adenopathy with abdominal fat graft, and microdissection.   CO SURGEON:  Danae Orleans. Venetia Maxon, M.D.   CO SURGEON:  Kristine Garbe. Ezzard Standing, M.D.   ANESTHESIA:  General endotracheal anesthesia.   ESTIMATED BLOOD LOSS:  The estimated blood loss was minimal.   COMPLICATIONS:  The complications are none.   DISPOSITION:  To the recovery room.   INDICATIONS:  Cynthia Hendrix is a 50 year old woman with a four-day history of  severe headache and vomiting, and with progressive complaints of the left  eye vision and facial numbness.  She went to [facility] emergency  room where a head CT was obtained followed by an MRI of the brain, which  showed a hemorrhagic pituitary mass __________  compression.  It was elected  to take her surgery for resection of this mass.   DESCRIPTION OF OPERATION:  Ms. Roszak was brought to the operating room.  Following satisfactory and uncomplicated induction of  general endotracheal  anesthesia she was placed in three-pin head fixation and placed in the  semisitting position with the brow up and head turned about 15 degrees to  the right.  The C-arm fluoroscopy unit was used to confirm positioning of  the sella and depth-width perception.   Dr. Narda Bonds then performed the exposure, placed a Hardy speculum and  approached the sella.  At this point I then came in.  The sellar floor was  firm and it  was elected to use a high-speed drill, and this was done with  thinning of the bone to expose the dura.  Then a 2 mm Kerrison punch was  used to remove sellar bone.  The dura was purplish and fairly firm.  It was  cauterized with bipolar electrocautery and then incised in a cruciate  fashion.   Hemorrhagic material with old blood clot was then evacuated along with  friable creamy material consisted with necrotic tumor.  Multiple biopsy  specimens were obtained.  This was done under microdissection.  Then after  debulking the tumor a variety of pituitary ring curets were used to remove  additional tumor.  The bulk of the tumor appeared to go towards the left and  friable tumor material was removed.  Subsequently there appeared to be  excellent hemostasis.  A small piece of Surgicel was placed over the dural  opening __________  and then subsequently removed.  The wound was irrigated.   A small piece of bone was fashioned to fit over the sellar defect and this  was wedged into position.  Subsequently abdominal fat was harvested from the  abdomen over the right lower quadrant.  This was then used to fill the  sphenoid sinus defect.   Dr. Ezzard Standing then came back and closed the exposure and the patient was taken  out of three-pin head fixation.  The patient was taken to recovery in stable  and satisfactory condition having apparently tolerated he operation  well.      Danae Orleans. Venetia Maxon, M.D.  Electronically Signed     JDS/MEDQ  D:  05/06/2005  T:  05/07/2005  Job:  [id number]

## 2010-12-12 NOTE — Op Note (Signed)
NAME:  Cynthia Hendrix, Cynthia Hendrix                          ACCOUNT NO.:  [id number]   MEDICAL RECORD NO.:  [id number]                   PATIENT TYPE:  INP   LOCATION:  9399                                 FACILITY:  [facility]   PHYSICIAN:  Charles A. Clearance Coots, M.D.             DATE OF BIRTH:  Apr 11, 1961   DATE OF PROCEDURE:  12/03/2003  DATE OF DISCHARGE:                                 OPERATIVE REPORT   PREOPERATIVE DIAGNOSIS:  Symptomatic uterine fibroids.   POSTOPERATIVE DIAGNOSIS:  Symptomatic uterine fibroids.   PROCEDURE:  Total abdominal hysterectomy, bilateral salpingectomy.   SURGEON:  Charles A. Clearance Coots, M.D.   ASSISTANT:  Kathreen Cosier, M.D.   ANESTHESIA:  General.   ESTIMATED BLOOD LOSS:  150 mL.   FLUIDS REPLACED:  3100 mL.   URINE OUTPUT:  200 mL clear.   COMPLICATIONS:  None.   SPECIMENS:  Uterus and fallopian tubes.   FINDINGS:  Fibroid uterus.   OPERATION:  The patient was brought to the operating room and after  satisfactory general endotracheal anesthesia, the abdomen was prepped and  draped in the usual sterile fashion.  Indwelling Foley to gravity was  inserted.  A Pfannenstiel incision was made with a scalpel.  This was  deepened down to the fascia with a scalpel.  The fascia was nicked in the  midline and the fascial incision was extended to the left and to the right  with curved Mayo scissors.  The superior and inferior fascial edges were  taken off of the rectus muscle with blunt and sharp dissection.  The rectus  muscle was bluntly divided in the midline and the peritoneum was digitally  entered and was digitally extended to the left and to the right.  The  O'Connor-O'Sullivan retractor was then placed in the incision and the bowel  was packed off and the anterior and posterior blades were placed.  The  uterus was exteriorized and the utero-ovarian, broad, and round ligaments  were crossclamped with Kelly forceps.  The round ligaments were transected  with  the Bovie bilaterally and the vesicouterine fold of peritoneum above  the reflection of urinary bladder was undermined and incised with Metzenbaum  scissors and the bladder was pushed down and away from the operative field.  The broad ligament on the right was then tented digitally and curved  parametrial clamps were placed across the utero-ovarian and broad ligaments  and these were transected bilaterally.  Free tie of 0 chromic was placed  beneath the clamp and a transfixion suture of 0 chromic was placed above the  knot.  The uterine vessels were skeletonized and then clamped, cut, and  suture ligated with transfixion sutures of 0 Vicryl bilaterally.  The  cardinal ligaments were then clamped, cut, and suture ligated with  transfixion sutures of 0 Vicryl bilaterally.  The uterosacral ligaments were  clamped with curved parametrial clamps, cut, and suture ligated with  transfixion sutures of 0 Vicryl bilaterally, and these were held with  hemostats for later identification and closure of the corners of the cuff.  Curved parametrial clamps were then placed across the vaginal cuff and met  in the center, and the cervix and uterus were then excised from the vaginal  cuff with long curved Satinsky scissors.  The specimen was submitted to  pathology for evaluation.  Corners of the vaginal cuff were then closed with  a transfixion suture of 0 Vicryl and the center of the vaginal cuff was  closed with interrupted sutures of 0 Vicryl.  Hemostasis was excellent.  The  pelvic cavity was thoroughly irrigated with warm saline solution.  The right  fallopian tube was then grasped with Babcock clamp and the isthmic area of  the tube was crossclamped with Kelly forceps across the entire base of the  mesosalpinx and the tube was then excised and submitted to pathology for  evaluation.  A transfixion suture of 0 Vicryl was placed beneath the clamp.  There was no active bleeding from the tubal stump.  The  same procedure was  performed on the opposite side without complications.  The closure of the  vaginal cuff was then reinspected, and there was no active bleeding noted.  The O'Connor-O'Sullivan retractor was then removed and all packing was  removed.  The surgical technician indicated that all needle, sponge, and  instrument counts were correct at this point.  The abdomen was then closed  as follows:  The peritoneum was closed with a continuous suture of 2-0  Monocryl.  The fascia was closed with a continuous suture of 2-0 PDS from  each corner to the center.  The subcutaneous tissue was thoroughly irrigated  with warm saline solution.  All areas of subcutaneous bleeding were  coagulated with the Bovie.  The skin was then approximated with continuous  subcuticular suture of 3-0 Monocryl.  Sterile bandage was applied to the  incision closure.  The surgical technician indicated that all needle,  sponge, and instrument counts were correct x2.  The patient tolerated the  procedure well and was transported to the recovery room in satisfactory  condition.                                               Charles A. Clearance Coots, M.D.    CAH/MEDQ  D:  12/03/2003  T:  12/03/2003  Job:  [id number]

## 2010-12-12 NOTE — Consult Note (Signed)
NAMEMALCOLM, HETZ                ACCOUNT NO.:  [id number]   MEDICAL RECORD NO.:  [id number]          PATIENT TYPE:  INP   LOCATION:  3109                         FACILITY:  [facility]   PHYSICIAN:  Kristine Garbe. Ezzard Standing, M.D.DATE OF BIRTH:  Jul 07, 1961   DATE OF CONSULTATION:  05/06/2005  DATE OF DISCHARGE:                                   CONSULTATION   BRIEF HISTORY:  Cynthia Hendrix is a 50 year old female who presented to the  [facility] Emergency Room with a four-day history of headache,  vomiting, blurry vision, and facial nerve numbness. 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   She was seen by Dr. Venetia Maxon who recommended emergent transsphenoidal resection  of pituitary tumor and I was consulted to assist in this surgery. She was  seen in the preoperative waiting area, was alert and oriented, complained of  headache, some blurry vision. She gives no history of nasal problems or  sinus problems. 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  Septum was relatively midline with no significant sinus disease noted. She  was taken to the operating room emergently for transseptal transsphenoidal  resection for resection of pituitary tumor.           ______________________________  Kristine Garbe. Ezzard Standing, M.D.     CEN/MEDQ  D:  05/06/2005  T:  05/07/2005  Job:  [id number]

## 2010-12-12 NOTE — Discharge Summary (Signed)
NAME:  Cynthia Hendrix, Cynthia Hendrix                          ACCOUNT NO.:  [id number]   MEDICAL RECORD NO.:  [id number]                   PATIENT TYPE:  INP   LOCATION:  9319                                 FACILITY:  [facility]   PHYSICIAN:  Charles A. Clearance Coots, M.D.             DATE OF BIRTH:  March 02, 1961   DATE OF ADMISSION:  12/03/2003  DATE OF DISCHARGE:  12/05/2003                                 DISCHARGE SUMMARY   ADMITTING DIAGNOSIS:  Symptomatic uterine fibroids.   DISCHARGE DIAGNOSIS:  Symptomatic uterine fibroids status post total  abdominal hysterectomy and bilateral salpingectomy.  Discharged home in good  condition.   REASON FOR ADMISSION:  A 50 year old black female with history of painful  heavy periods for several years.  Had tried medical therapy including  nonsteroidals and hormonal therapy without success.  They had progressively  gotten heavier and more painful over the last several months.  The patient  desires definitive surgical therapy.   PAST MEDICAL HISTORY:  BTL, tonsillectomy.   ILLNESSES:  Hypertension.   MEDICATIONS:  Toprol-XL and Benicar, iron.   ALLERGIES:  No known drug allergies.   SOCIAL HISTORY:  Single.  Negative alcohol or substance abuse.  Positive  cigarettes, approximately 10 cigarettes per day.   FAMILY HISTORY:  Negative for gynecologic cancer.   PHYSICAL EXAMINATION:  GENERAL:  Well-nourished, well-developed black female  in no acute distress.  VITAL SIGNS:  Temperature 98.2, pulse 68, respiratory rate 20, blood  pressure 160/96.  HEENT:  Normal.  NECK:  Supple without adenopathy.  LUNGS:  Clear to auscultation bilaterally.  HEART:  Regular rate and rhythm.  BREASTS:  Soft, nontender, negative for masses.  ABDOMEN:  Soft, nontender, negative for organomegaly.  PELVIC:  Normal external female genitalia, vaginal mucosa normal.  Cervix  parous without lesions, discharge, or bleeding.  Uterus approximately 16-  week size, tender.  Adnexa not felt.  EXTREMITIES:  Without cyanosis, clubbing, or edema.  Skin is normal.   ADMITTING LABORATORY VALUES:  Hemoglobin 12.7; hematocrit 40; white blood  cell count 7300; platelets 282,000.  Coags were within normal limits.  Electrolytes were within normal limits.  Urinalysis was within normal  limits.   HOSPITAL COURSE:  The patient underwent a total abdominal hysterectomy and  bilateral salpingectomy on Dec 03, 2003.  There were no intraoperative  complications.  Postoperative course was uncomplicated and the patient was  discharged home on postoperative day #2 in good condition.   DISCHARGE LABORATORY VALUES:  Hemoglobin 9.8; hematocrit 30.7; white blood  cell count 9100; platelets 248,000.   DISCHARGE DISPOSITION:  1. Medications:  Tylox and ibuprofen prescribed for pain; continue her iron.  2. Routine written instructions per booklet were given for surgical     discharge.  3. The patient is to follow up in the office for a checkup in 2 weeks.  Charles A. Clearance Coots, M.D.    CAH/MEDQ  D:  12/18/2003  T:  12/18/2003  Job:  [id number]

## 2010-12-12 NOTE — Discharge Summary (Signed)
Cynthia Hendrix, Cynthia Hendrix                ACCOUNT NO.:  [id number]   MEDICAL RECORD NO.:  [id number]          PATIENT TYPE:  INP   LOCATION:  3010                         FACILITY:  [facility]   PHYSICIAN:  Danae Orleans. Venetia Maxon, M.D.  DATE OF BIRTH:  09-29-1960   DATE OF ADMISSION:  05/06/2005  DATE OF DISCHARGE:  05/11/2005                                 DISCHARGE SUMMARY   REASON FOR ADMISSION:  1.  Pituitary neoplasm.  2.  Hypertension.  3.  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  4.  Tobacco use disorder.   FINAL DIAGNOSES:  1.  Pituitary neoplasm.  2.  Hypertension.  3.  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  4.  Tobacco use disorder.   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  She has uncontrolled hypertension.   The patient was admitted to the hospital and was taken straight to the  operating room, where she underwent transsphenoidal resection of pituitary  adenoma with an abdominal fat graft placed; this was done by Dr. Venetia Maxon and  Dr. Ezzard Standing from [facility].  Postoperatively, she had significant  improvement in ptosis of the left lid and also blurring and double vision.  She gradually made significant improvement, was doing well on the 15th and  was transferred to the floor.  On the 16th, she was doing well and had some  occasional leg cramps, otherwise, no significant problems and was discharged  home on oral hydrocortisone and blood pressure medications prescribed with  the assistance of the [facility]; these medications  include:   DISCHARGE MEDICATIONS:  1.  Micardis.  2.  Sular.  3.  Coreg.  4.  Percocet for pain.  5.  Valium for muscle  spasms.  6.  Hydrocortisone 25 mg twice daily.   FOLLOWUP:  The plan is for her to return to the office in 2 weeks for  recheck and to be followed by the endocrinologist after discharge and by her  primary physician with regard to blood pressure management.      Danae Orleans. Venetia Maxon, M.D.  Electronically Signed     JDS/MEDQ  D:  06/25/2005  T:  06/26/2005  Job:  [id number]

## 2011-08-07 ENCOUNTER — Other Ambulatory Visit (INDEPENDENT_AMBULATORY_CARE_PROVIDER_SITE_OTHER): Payer: 59

## 2011-08-07 ENCOUNTER — Encounter: Payer: Self-pay | Admitting: Internal Medicine

## 2011-08-07 ENCOUNTER — Ambulatory Visit (INDEPENDENT_AMBULATORY_CARE_PROVIDER_SITE_OTHER): Payer: 59 | Admitting: Internal Medicine

## 2011-08-07 DIAGNOSIS — I10 Essential (primary) hypertension: Secondary | ICD-10-CM

## 2011-08-07 DIAGNOSIS — F17200 Nicotine dependence, unspecified, uncomplicated: Secondary | ICD-10-CM

## 2011-08-07 DIAGNOSIS — F418 Other specified anxiety disorders: Secondary | ICD-10-CM | POA: Insufficient documentation

## 2011-08-07 DIAGNOSIS — Z1231 Encounter for screening mammogram for malignant neoplasm of breast: Secondary | ICD-10-CM | POA: Insufficient documentation

## 2011-08-07 DIAGNOSIS — D649 Anemia, unspecified: Secondary | ICD-10-CM

## 2011-08-07 DIAGNOSIS — F341 Dysthymic disorder: Secondary | ICD-10-CM

## 2011-08-07 LAB — CBC WITH DIFFERENTIAL/PLATELET
Basophils Absolute: 0.1 10*3/uL (ref 0.0–0.1)
Basophils Relative: 1.3 % (ref 0.0–3.0)
Eosinophils Absolute: 0.2 10*3/uL (ref 0.0–0.7)
Eosinophils Relative: 2.4 % (ref 0.0–5.0)
HCT: 45.1 % (ref 36.0–46.0)
Hemoglobin: 15.3 g/dL — ABNORMAL HIGH (ref 12.0–15.0)
Lymphocytes Relative: 33 % (ref 12.0–46.0)
Lymphs Abs: 2.3 10*3/uL (ref 0.7–4.0)
MCHC: 33.8 g/dL (ref 30.0–36.0)
MCV: 91.4 fl (ref 78.0–100.0)
Monocytes Absolute: 0.5 10*3/uL (ref 0.1–1.0)
Monocytes Relative: 7.1 % (ref 3.0–12.0)
Neutro Abs: 3.9 10*3/uL (ref 1.4–7.7)
Neutrophils Relative %: 56.2 % (ref 43.0–77.0)
Platelets: 243 10*3/uL (ref 150.0–400.0)
RBC: 4.94 Mil/uL (ref 3.87–5.11)
RDW: 14.6 % (ref 11.5–14.6)
WBC: 7 10*3/uL (ref 4.5–10.5)

## 2011-08-07 LAB — COMPREHENSIVE METABOLIC PANEL
AST: 14 U/L (ref 0–37)
Alkaline Phosphatase: 82 U/L (ref 39–117)
BUN: 10 mg/dL (ref 6–23)
CO2: 32 mEq/L (ref 19–32)
Chloride: 104 mEq/L (ref 96–112)
Creatinine, Ser: 0.8 mg/dL (ref 0.4–1.2)
Glucose, Bld: 98 mg/dL (ref 70–99)
Potassium: 3.6 mEq/L (ref 3.5–5.1)
Total Bilirubin: 0.6 mg/dL (ref 0.3–1.2)

## 2011-08-07 LAB — URINALYSIS, ROUTINE W REFLEX MICROSCOPIC
Hgb urine dipstick: NEGATIVE
Ketones, ur: NEGATIVE
Leukocytes, UA: NEGATIVE
Nitrite: NEGATIVE
Specific Gravity, Urine: 1.015 (ref 1.000–1.030)
Total Protein, Urine: NEGATIVE
pH: 6.5 (ref 5.0–8.0)

## 2011-08-07 LAB — LIPID PANEL
HDL: 39.8 mg/dL (ref >39.00)
LDL Cholesterol: 108 mg/dL — ABNORMAL HIGH (ref 0–99)

## 2011-08-07 LAB — TSH: TSH: 1.39 u[IU]/mL (ref 0.35–5.50)

## 2011-08-07 MED ORDER — OLMESARTAN MEDOXOMIL-HCTZ 40-25 MG PO TABS: 1.0000 | ORAL_TABLET | Freq: Every day | ORAL | Status: DC

## 2011-08-07 MED ORDER — VENLAFAXINE HCL ER 37.5 MG PO CP24: 37.5000 mg | ORAL_CAPSULE | Freq: Every day | ORAL | Status: DC

## 2011-08-07 MED ORDER — CARVEDILOL 12.5 MG PO TABS: 12.5000 mg | ORAL_TABLET | Freq: Two times a day (BID) | ORAL | Status: DC

## 2011-08-07 NOTE — Patient Instructions (Addendum)
Hypertension As your heart beats, it forces blood through your arteries. This force is your blood pressure. If the pressure is too high, it is called hypertension (HTN) or high blood pressure. HTN is dangerous because you may have it and not know it. High blood pressure may mean that your heart has to work harder to pump blood. Your arteries may be narrow or stiff. The extra work puts you at risk for heart disease, stroke, and other problems.  Blood pressure consists of two numbers, a higher number over a lower, 110/72, for example. It is stated as "110 over 72." The ideal is below 120 for the top number (systolic) and under 80 for the bottom (diastolic). Write down your blood pressure today. You should pay close attention to your blood pressure if you have certain conditions such as:  Heart failure.   Prior heart attack.   Diabetes   Chronic kidney disease.   Prior stroke.   Multiple risk factors for heart disease.  To see if you have HTN, your blood pressure should be measured while you are seated with your arm held at the level of the heart. It should be measured at least twice. A one-time elevated blood pressure reading (especially in the Emergency Department) does not mean that you need treatment. There may be conditions in which the blood pressure is different between your right and left arms. It is important to see your caregiver soon for a recheck. Most people have essential hypertension which means that there is not a specific cause. This type of high blood pressure may be lowered by changing lifestyle factors such as:  Stress.   Smoking.   Lack of exercise.   Excessive weight.   Drug/tobacco/alcohol use.   Eating less salt.  Most people do not have symptoms from high blood pressure until it has caused damage to the body. Effective treatment can often prevent, delay or reduce that damage. TREATMENT  When a cause has been identified, treatment for high blood pressure is  directed at the cause. There are a large number of medications to treat HTN. These fall into several categories, and your caregiver will help you select the medicines that are best for you. Medications may have side effects. You should review side effects with your caregiver. If your blood pressure stays high after you have made lifestyle changes or started on medicines,   Your medication(s) may need to be changed.   Other problems may need to be addressed.   Be certain you understand your prescriptions, and know how and when to take your medicine.   Be sure to follow up with your caregiver within the time frame advised (usually within two weeks) to have your blood pressure rechecked and to review your medications.   If you are taking more than one medicine to lower your blood pressure, make sure you know how and at what times they should be taken. Taking two medicines at the same time can result in blood pressure that is too low.  SEEK IMMEDIATE MEDICAL CARE IF:  You develop a severe headache, blurred or changing vision, or confusion.   You have unusual weakness or numbness, or a faint feeling.   You have severe chest or abdominal pain, vomiting, or breathing problems.  MAKE SURE YOU:   Understand these instructions.   Will watch your condition.   Will get help right away if you are not doing well or get worse.  Document Released: 07/13/2005 Document Revised: 03/25/2011 Document Reviewed:   03/02/2008 Exit[employer] Patient Information 2012 [employer].Depression, Adolescent and Adult Depression is a true and treatable medical condition. In general there are two kinds of depression:  Depression we all experience in some form. For example depression from the death of a loved one, financial distress or natural disasters will trigger or increase depression.   Clinical depression, on the other hand, appears without an apparent cause or reason. This depression is a disease. Depression may be  caused by chemical imbalance in the body and brain or may come as a response to a physical illness. Alcohol and other drugs can cause depression.  DIAGNOSIS  The diagnosis of depression is usually based upon symptoms and medical history. TREATMENT  Treatments for depression fall into three categories. These are:  Drug therapy. There are many medicines that treat depression. Responses may vary and sometimes trial and error is necessary to determine the best medicines and dosage for a particular patient.   Psychotherapy, also called talking treatments, helps people resolve their problems by looking at them from a different point of view and by giving people insight into their own personal makeup. Traditional psychotherapy looks at a childhood source of a problem. Other psychotherapy will look at current conflicts and move toward solving those. If the cause of depression is drug use, counseling is available to help abstain. In time the depression will usually improve. If there were underlying causes for the chemical use, they can be addressed.   ECT (electroconvulsive therapy) or shock treatment is not as commonly used today. It is a very effective treatment for severe suicidal depression. During ECT electrical impulses are applied to the head. These impulses cause a generalized seizure. It can be effective but causes a loss of memory for recent events. Sometimes this loss of memory may include the last several months.  Treat all depression or suicide threats as serious. Obtain professional help. Do not wait to see if serious depression will get better over time without help. Seek help for yourself or those around you. In the [place] the number to the National Suicide Help Lines With 24 Hour Help Are: 1-800-SUICIDE 1-800-784-2433 Document Released: 07/10/2000 Document Revised: 03/25/2011 Document Reviewed: 02/29/2008 Exit[employer] Patient Information 2012 [employer]. 

## 2011-08-07 NOTE — Progress Notes (Signed)
Subjective:    Patient ID: Cynthia Hendrix, female    DOB: 08-23-1960, 51 y.o.   MRN: [id number]  Hypertension This is a chronic problem. The current episode started more than 1 year ago. The problem has been gradually worsening since onset. The problem is uncontrolled. Associated symptoms include malaise/fatigue. Pertinent negatives include no anxiety, blurred vision, chest pain, headaches, neck pain, orthopnea, palpitations, peripheral edema, PND, shortness of breath or sweats. There are no associated agents to hypertension. Past treatments include nothing. Compliance problems include psychosocial issues, exercise, diet and medication cost.   Anemia Presents for follow-up visit. Symptoms include malaise/fatigue. There has been no abdominal pain, anorexia, bruising/bleeding easily, confusion, fever, leg swelling, light-headedness, pallor, palpitations, paresthesias, pica or weight loss. Signs of blood loss that are not present include hematemesis, hematochezia, melena and vaginal bleeding. There are no compliance problems.       Review of Systems  Constitutional: Positive for malaise/fatigue. Negative for fever, chills, weight loss, diaphoresis, activity change, appetite change, fatigue and unexpected weight change.  HENT: Negative for facial swelling, neck pain and neck stiffness.   Eyes: Negative.  Negative for blurred vision.  Respiratory: Negative for cough, chest tightness, shortness of breath, wheezing and stridor.   Cardiovascular: Negative for chest pain, palpitations, orthopnea, leg swelling and PND.  Gastrointestinal: Negative for nausea, vomiting, abdominal pain, diarrhea, constipation, blood in stool, melena, hematochezia, anorexia and hematemesis.  Genitourinary: Negative for dysuria, urgency, frequency, hematuria, flank pain, decreased urine volume, vaginal bleeding, enuresis, difficulty urinating and dyspareunia.  Musculoskeletal: Negative for myalgias, back pain, joint swelling,  arthralgias and gait problem.  Skin: Negative for color change, pallor, rash and wound.  Neurological: Negative for dizziness, tremors, seizures, syncope, facial asymmetry, speech difficulty, weakness, light-headedness, numbness, headaches and paresthesias.  Hematological: Negative for adenopathy. Does not bruise/bleed easily.  Psychiatric/Behavioral: Positive for sleep disturbance (insomnia) and dysphoric mood. Negative for suicidal ideas, hallucinations, behavioral problems, confusion, self-injury, decreased concentration and agitation. The patient is nervous/anxious. The patient is not hyperactive.        Objective:   Physical Exam  Vitals reviewed. Constitutional: She is oriented to person, place, and time. She appears well-developed and well-nourished. No distress.  HENT:  Head: Normocephalic and atraumatic.  Mouth/Throat: Oropharynx is clear and moist. No oropharyngeal exudate.  Eyes: Conjunctivae are normal. Right eye exhibits no discharge. Left eye exhibits no discharge. No scleral icterus.  Neck: Normal range of motion. Neck supple. No JVD present. No tracheal deviation present. No thyromegaly present.  Cardiovascular: Normal rate, regular rhythm, normal heart sounds and intact distal pulses.  Exam reveals no gallop and no friction rub.   No murmur heard. Pulmonary/Chest: Effort normal and breath sounds normal. No stridor. No respiratory distress. She has no wheezes. She has no rales. She exhibits no tenderness.  Abdominal: Soft. Bowel sounds are normal. She exhibits no distension and no mass. There is no tenderness. There is no rebound and no guarding.  Musculoskeletal: Normal range of motion. She exhibits no edema.  Lymphadenopathy:    She has no cervical adenopathy.  Neurological: She is oriented to person, place, and time.  Skin: Skin is warm and dry. No rash noted. She is not diaphoretic. No erythema. No pallor.  Psychiatric: Her behavior is normal. Judgment and thought  content normal. Her mood appears anxious. Her affect is not angry, not blunt, not labile and not inappropriate. Her speech is not rapid and/or pressured, not delayed, not tangential and not slurred. She is not agitated, not aggressive,  is not hyperactive, not slowed, not withdrawn and not actively hallucinating. Cognition and memory are normal. Cognition and memory are not impaired. She does not express impulsivity or inappropriate judgment. She exhibits a depressed mood (sad and tearful). She expresses no suicidal plans and no homicidal plans. She is communicative. She exhibits normal recent memory and normal remote memory. She is attentive.      Lab Results  Component Value Date   WBC 6.4 04/23/2010   HGB 14.9 04/23/2010   HCT 43.4 04/23/2010   PLT 220.0 04/23/2010   GLUCOSE 88 04/23/2010   ALT 11 04/23/2010   AST 15 04/23/2010   NA 140 04/23/2010   K 3.9 04/23/2010   CL 103 04/23/2010   CREATININE 0.8 04/23/2010   BUN 7 04/23/2010   CO2 29 04/23/2010   TSH 1.34 04/23/2010      Assessment & Plan:

## 2011-08-08 NOTE — Assessment & Plan Note (Signed)
She reports some perimenopausal symptoms such as night sweats and mood swings so I think that effexor will be a good choice for her

## 2011-08-08 NOTE — Assessment & Plan Note (Signed)
I will check her CBC today 

## 2011-08-08 NOTE — Assessment & Plan Note (Signed)
She will restart meds, I gave her samples of tribenzor today, I will check her labs today for secondary causes and will look for end organ damage

## 2011-08-08 NOTE — Assessment & Plan Note (Signed)
I asked her to quit smoking 

## 2011-08-25 ENCOUNTER — Encounter: Payer: Self-pay | Admitting: Internal Medicine

## 2011-09-11 ENCOUNTER — Ambulatory Visit: Payer: 59 | Admitting: Internal Medicine

## 2011-09-18 ENCOUNTER — Ambulatory Visit: Payer: 59 | Admitting: Internal Medicine

## 2011-09-18 DIAGNOSIS — Z0289 Encounter for other administrative examinations: Secondary | ICD-10-CM

## 2011-11-19 ENCOUNTER — Ambulatory Visit (INDEPENDENT_AMBULATORY_CARE_PROVIDER_SITE_OTHER): Payer: 59 | Admitting: Emergency Medicine

## 2011-11-19 VITALS — BP 218/137 | HR 91 | Temp 98.8°F | Resp 18 | Ht 71.5 in | Wt 180.0 lb

## 2011-11-19 DIAGNOSIS — R509 Fever, unspecified: Secondary | ICD-10-CM

## 2011-11-19 DIAGNOSIS — R52 Pain, unspecified: Secondary | ICD-10-CM

## 2011-11-19 DIAGNOSIS — I10 Essential (primary) hypertension: Secondary | ICD-10-CM

## 2011-11-19 DIAGNOSIS — R05 Cough: Secondary | ICD-10-CM

## 2011-11-19 LAB — POCT INFLUENZA A/B
Influenza A, POC: NEGATIVE
Influenza B, POC: NEGATIVE

## 2011-11-19 LAB — POCT CBC
Lymph, poc: 1.4 (ref 0.6–3.4)
MCH, POC: 29.8 pg (ref 27–31.2)
MCHC: 32.8 g/dL (ref 31.8–35.4)
Platelet Count, POC: 228 10*3/uL (ref 142–424)

## 2011-11-19 MED ORDER — OLMESARTAN MEDOXOMIL-HCTZ 40-25 MG PO TABS: 1.0000 | ORAL_TABLET | Freq: Every day | ORAL | Status: DC

## 2011-11-19 NOTE — Progress Notes (Signed)
  Subjective:    Patient ID: Cynthia Hendrix, female    DOB: [birth year], 51 y.o.   MRN: [id number]  HPI patient presents with onset yesterday of fever and myalgias. She has had a mild cough. She denies sore throat head congestion or ear discomfort. She states she did take a flu shot last year. She has had no nausea vomiting diarrhea or GI complaints.    Review of Systems patient has a long history of hypertension. Her blood pressure has been very difficult to control. She has run out of her Benicar and feels this may be contributing to her elevated blood pressure today.     Objective:   Physical Exam  Constitutional: She appears well-developed and well-nourished.  HENT:  Head: Normocephalic.  Right Ear: External ear normal.  Left Ear: External ear normal.  Eyes: Pupils are equal, round, and reactive to light.  Neck: No tracheal deviation present. No thyromegaly present.  Cardiovascular: Normal rate and regular rhythm.  Exam reveals no gallop and no friction rub.   No murmur heard. Pulmonary/Chest: Effort normal. No respiratory distress. She has no wheezes. She has no rales.    [facility] reading (PRIMARY) by  Dr.Danyell Awbrey there appears to be a streaky retrocardiac infiltrate on the lateral. No definite infiltrate seen on the PA.  Results for orders placed in visit on 11/19/11  POCT CBC      Component Value Range   WBC 4.1 (*) 4.6 - 10.2 (K/uL)   Lymph, poc 1.4  0.6 - 3.4    POC LYMPH PERCENT 33.3  10 - 50 (%L)   MID (cbc) 0.4  0 - 0.9    POC MID % 9.9  0 - 12 (%M)   POC Granulocyte 2.3  2 - 6.9    Granulocyte percent 56.8  37 - 80 (%G)   RBC 5.10  4.04 - 5.48 (M/uL)   Hemoglobin 15.2  12.2 - 16.2 (g/dL)   HCT, POC 40.9  81.1 - 47.9 (%)   MCV 90.9  80 - 97 (fL)   MCH, POC 29.8  27 - 31.2 (pg)   MCHC 32.8  31.8 - 35.4 (g/dL)   RDW, POC 91.4     Platelet Count, POC 228  142 - 424 (K/uL)   MPV 10.9  0 - 99.8 (fL)  POCT INFLUENZA A/B      Component Value Range   Influenza A, POC Negative      Influenza B, POC Negative          Assessment & Plan:   Patient presents with flulike symptoms of myalgias. She has minimal other symptoms. Her main issue is her elevated blood pressure which is 210/128. And she has been out of her blood pressure medication

## 2011-11-20 ENCOUNTER — Telehealth: Payer: Self-pay

## 2011-11-20 NOTE — Telephone Encounter (Signed)
Ok to extend OOW note to 4 days.

## 2011-11-20 NOTE — Telephone Encounter (Signed)
Pt states that she needs her OOW note extended to Monday, because her job states that they are giving her a final warning and she needs the note for 4 days for her to keep her job.  Pt states that Dr Cleta Alberts knows the situation she is in and would like for him to give her a call back,

## 2011-11-20 NOTE — Telephone Encounter (Signed)
Can we give this OOW note to her?

## 2011-11-20 NOTE — Telephone Encounter (Signed)
Dr. Cleta Alberts,  Is it ok with you to extend her OOW note to Monday? Would you like to call her or would you like the TL to gather more information?

## 2011-11-21 NOTE — Telephone Encounter (Signed)
Pt is coming in on Monday for a BP check and will get note then

## 2011-11-23 ENCOUNTER — Ambulatory Visit (INDEPENDENT_AMBULATORY_CARE_PROVIDER_SITE_OTHER): Payer: 59 | Admitting: Emergency Medicine

## 2011-11-23 VITALS — BP 236/130 | HR 83 | Temp 98.3°F | Resp 16 | Ht 69.5 in | Wt 182.4 lb

## 2011-11-23 DIAGNOSIS — F430 Acute stress reaction: Secondary | ICD-10-CM

## 2011-11-23 DIAGNOSIS — I10 Essential (primary) hypertension: Secondary | ICD-10-CM

## 2011-11-23 NOTE — Progress Notes (Signed)
  Subjective:    Patient ID: Cynthia Hendrix, female    DOB: [birth year], 51 y.o.   MRN: [id number]  HPI patient had a followup on her blood pressure. She is under a great of stress at home related to a son who may go to prison for felony charges of armed possession. She denies chest pain shortness of breath headache.    Review of Systems noncontributory     Objective:   Physical Exam multiple blood pressures are obtained they seem to run 220-230/120 to 130        Assessment & Plan:  Hypertension not at goal. She has significant elevation of her blood pressure. We'll increase her Coreg to 25 twice a day followup on Friday to

## 2011-11-25 DIAGNOSIS — Z0271 Encounter for disability determination: Secondary | ICD-10-CM

## 2011-11-27 ENCOUNTER — Ambulatory Visit (INDEPENDENT_AMBULATORY_CARE_PROVIDER_SITE_OTHER): Payer: 59 | Admitting: Internal Medicine

## 2011-11-27 ENCOUNTER — Encounter: Payer: Self-pay | Admitting: Internal Medicine

## 2011-11-27 ENCOUNTER — Other Ambulatory Visit (INDEPENDENT_AMBULATORY_CARE_PROVIDER_SITE_OTHER): Payer: 59

## 2011-11-27 VITALS — BP 238/130 | HR 84 | Temp 99.0°F | Resp 16 | Wt 182.0 lb

## 2011-11-27 DIAGNOSIS — I10 Essential (primary) hypertension: Secondary | ICD-10-CM | POA: Insufficient documentation

## 2011-11-27 DIAGNOSIS — R0989 Other specified symptoms and signs involving the circulatory and respiratory systems: Secondary | ICD-10-CM

## 2011-11-27 DIAGNOSIS — F17200 Nicotine dependence, unspecified, uncomplicated: Secondary | ICD-10-CM

## 2011-11-27 DIAGNOSIS — R0683 Snoring: Secondary | ICD-10-CM

## 2011-11-27 LAB — COMPREHENSIVE METABOLIC PANEL
Alkaline Phosphatase: 80 U/L (ref 39–117)
Creatinine, Ser: 0.8 mg/dL (ref 0.4–1.2)
GFR: 100.96 mL/min (ref >60.00)
Glucose, Bld: 99 mg/dL (ref 70–99)
Sodium: 142 mEq/L (ref 135–145)
Total Bilirubin: 0.7 mg/dL (ref 0.3–1.2)
Total Protein: 7.8 g/dL (ref 6.0–8.3)

## 2011-11-27 LAB — TSH: TSH: 1.99 u[IU]/mL (ref 0.35–5.50)

## 2011-11-27 LAB — CBC WITH DIFFERENTIAL/PLATELET
Basophils Relative: 0.6 % (ref 0.0–3.0)
Eosinophils Relative: 2.6 % (ref 0.0–5.0)
HCT: 46.2 % — ABNORMAL HIGH (ref 36.0–46.0)
Hemoglobin: 15.5 g/dL — ABNORMAL HIGH (ref 12.0–15.0)
Lymphs Abs: 2.4 10*3/uL (ref 0.7–4.0)
MCV: 91 fl (ref 78.0–100.0)
Monocytes Absolute: 0.6 10*3/uL (ref 0.1–1.0)
Monocytes Relative: 6.5 % (ref 3.0–12.0)
Neutro Abs: 5.4 10*3/uL (ref 1.4–7.7)
Platelets: 268 10*3/uL (ref 150.0–400.0)
WBC: 8.6 10*3/uL (ref 4.5–10.5)

## 2011-11-27 LAB — URINALYSIS, ROUTINE W REFLEX MICROSCOPIC
Bilirubin Urine: NEGATIVE
Hgb urine dipstick: NEGATIVE
Ketones, ur: NEGATIVE
Nitrite: NEGATIVE
Total Protein, Urine: NEGATIVE
Urine Glucose: NEGATIVE
pH: 6 (ref 5.0–8.0)

## 2011-11-27 MED ORDER — OLMESARTAN-AMLODIPINE-HCTZ 40-10-25 MG PO TABS: 1.0000 | ORAL_TABLET | Freq: Every day | ORAL | Status: DC

## 2011-11-27 MED ORDER — CLONIDINE HCL 0.1 MG PO TABS: 0.1000 mg | ORAL_TABLET | Freq: Two times a day (BID) | ORAL | Status: DC

## 2011-11-27 MED ORDER — CARVEDILOL 25 MG PO TABS: 25.0000 mg | ORAL_TABLET | Freq: Two times a day (BID) | ORAL | Status: DC

## 2011-11-27 NOTE — Patient Instructions (Signed)

## 2011-11-27 NOTE — Assessment & Plan Note (Signed)
She is down to 4-5 cigs per day but she is not ready to completely quit yet

## 2011-11-27 NOTE — Assessment & Plan Note (Signed)
I have increased her meds by adding clonidine and norvasc as well as raising the dose of her coreg, I am concerned that she has OSA so I have asked her to get a sleep study done and will check her for RAS with a renal artery duplex scan, also I will check her lytes and renal function and will look for end organ damage

## 2011-11-27 NOTE — Progress Notes (Signed)
Subjective:    Patient ID: Cynthia Hendrix, female    DOB: [birth year], 51 y.o.   MRN: [id number]  Hypertension This is a chronic problem. The current episode started more than 1 year ago. The problem has been gradually worsening since onset. The problem is uncontrolled. Pertinent negatives include no anxiety, blurred vision, chest pain, headaches, malaise/fatigue, neck pain, orthopnea, palpitations, peripheral edema, PND, shortness of breath or sweats. Past treatments include beta blockers, angiotensin blockers and diuretics. The current treatment provides mild improvement. Compliance problems include exercise and diet.  Identifiable causes of hypertension include sleep apnea.      Review of Systems  Constitutional: Negative for fever, chills, malaise/fatigue, diaphoresis, activity change, appetite change, fatigue and unexpected weight change.  HENT: Negative.  Negative for neck pain.   Eyes: Negative.  Negative for blurred vision.  Respiratory: Positive for apnea (and heavy snoring). Negative for cough, choking, chest tightness, shortness of breath, wheezing and stridor.   Cardiovascular: Negative for chest pain, palpitations, orthopnea, leg swelling and PND.  Gastrointestinal: Negative for nausea, vomiting, abdominal pain, diarrhea, constipation, blood in stool, abdominal distention, anal bleeding and rectal pain.  Genitourinary: Negative.   Musculoskeletal: Negative for myalgias, back pain, joint swelling, arthralgias and gait problem.  Skin: Negative for color change, pallor, rash and wound.  Neurological: Negative for dizziness, tremors, seizures, syncope, facial asymmetry, speech difficulty, weakness, light-headedness, numbness and headaches.  Hematological: Negative for adenopathy. Does not bruise/bleed easily.  Psychiatric/Behavioral: Positive for sleep disturbance. Negative for suicidal ideas, hallucinations, behavioral problems, confusion, self-injury, dysphoric mood, decreased  concentration and agitation. The patient is not nervous/anxious and is not hyperactive.        Objective:   Physical Exam  Vitals reviewed. Constitutional: She is oriented to person, place, and time. She appears well-developed and well-nourished. No distress.  HENT:  Head: Normocephalic and atraumatic.  Mouth/Throat: Oropharynx is clear and moist. No oropharyngeal exudate.  Eyes: Conjunctivae are normal. Right eye exhibits no discharge. Left eye exhibits no discharge. No scleral icterus.  Neck: Normal range of motion. Neck supple. No JVD present. No tracheal deviation present. No thyromegaly present.  Cardiovascular: Normal rate, regular rhythm, normal heart sounds and intact distal pulses.  Exam reveals no gallop and no friction rub.   No murmur heard. Pulmonary/Chest: Effort normal and breath sounds normal. No stridor. No respiratory distress. She has no wheezes. She has no rales. She exhibits no tenderness.  Abdominal: Soft. Bowel sounds are normal. She exhibits no distension and no mass. There is no tenderness. There is no rebound and no guarding.  Musculoskeletal: Normal range of motion. She exhibits no edema and no tenderness.  Lymphadenopathy:    She has no cervical adenopathy.  Neurological: She is oriented to person, place, and time.  Skin: Skin is warm and dry. No rash noted. She is not diaphoretic. No erythema. No pallor.  Psychiatric: She has a normal mood and affect. Her behavior is normal. Judgment and thought content normal.      Lab Results  Component Value Date   WBC 8.6 11/27/2011   HGB 15.5* 11/27/2011   HCT 46.2* 11/27/2011   PLT 268.0 11/27/2011   GLUCOSE 99 11/27/2011   CHOL 172 08/07/2011   TRIG 123.0 08/07/2011   HDL 39.80 08/07/2011   LDLCALC 108* 08/07/2011   ALT 12 11/27/2011   AST 14 11/27/2011   NA 142 11/27/2011   K 3.5 11/27/2011   CL 103 11/27/2011   CREATININE 0.8 11/27/2011   BUN 10 11/27/2011  CO2 30 11/27/2011   TSH 1.99 11/27/2011      Assessment & Plan:

## 2011-11-27 NOTE — Assessment & Plan Note (Signed)
She has been referred for a sleep study

## 2011-12-04 DIAGNOSIS — Z0279 Encounter for issue of other medical certificate: Secondary | ICD-10-CM

## 2011-12-23 ENCOUNTER — Institutional Professional Consult (permissible substitution): Payer: 59 | Admitting: Pulmonary Disease

## 2012-11-17 ENCOUNTER — Ambulatory Visit (INDEPENDENT_AMBULATORY_CARE_PROVIDER_SITE_OTHER): Payer: 59 | Admitting: Internal Medicine

## 2012-11-17 ENCOUNTER — Encounter: Payer: Self-pay | Admitting: Internal Medicine

## 2012-11-17 VITALS — BP 210/130 | HR 80 | Temp 98.0°F | Wt 175.0 lb

## 2012-11-17 DIAGNOSIS — F329 Major depressive disorder, single episode, unspecified: Secondary | ICD-10-CM

## 2012-11-17 DIAGNOSIS — G4700 Insomnia, unspecified: Secondary | ICD-10-CM

## 2012-11-17 DIAGNOSIS — J309 Allergic rhinitis, unspecified: Secondary | ICD-10-CM

## 2012-11-17 DIAGNOSIS — I10 Essential (primary) hypertension: Secondary | ICD-10-CM

## 2012-11-17 MED ORDER — OLMESARTAN-AMLODIPINE-HCTZ 40-10-25 MG PO TABS: 1.0000 | ORAL_TABLET | Freq: Every day | ORAL | Status: DC

## 2012-11-17 MED ORDER — METHYLPREDNISOLONE ACETATE 80 MG/ML IJ SUSP
80.0000 mg | Freq: Once | INTRAMUSCULAR | Status: AC
  Administered 2012-11-17: 80 mg via INTRAMUSCULAR

## 2012-11-17 MED ORDER — MIRTAZAPINE 15 MG PO TABS: 15.0000 mg | ORAL_TABLET | Freq: Every day | ORAL | Status: DC

## 2012-11-17 MED ORDER — CARVEDILOL 25 MG PO TABS: 25.0000 mg | ORAL_TABLET | Freq: Two times a day (BID) | ORAL | Status: DC

## 2012-11-17 MED ORDER — PREDNISONE 10 MG PO TABS: ORAL_TABLET | ORAL | Status: DC

## 2012-11-17 NOTE — Assessment & Plan Note (Signed)
Reassurance given Will start remeron for depression and insomnia Can increase to 30 mg in 2 weeks if you feel it is not effective enough

## 2012-11-17 NOTE — Assessment & Plan Note (Signed)
Continue current medications  Will give 80 mg Depo IM today eRx for pred taper

## 2012-11-17 NOTE — Assessment & Plan Note (Signed)
Uncontrolled secondary to being off medication Refills given today Sample of Tribenzor given today Repeat blood pressure 189/115  RTC in 1 week for blood pressure check

## 2012-11-17 NOTE — Patient Instructions (Signed)
Allergic Rhinitis  Allergic rhinitis is when the mucous membranes in the nose respond to allergens. Allergens are particles in the air that cause your body to have an allergic reaction. This causes you to release allergic antibodies. Through a chain of events, these eventually cause you to release histamine into the blood stream (hence the use of antihistamines). Although meant to be protective to the body, it is this release that causes your discomfort, such as frequent sneezing, congestion and an itchy runny nose.    CAUSES    The pollen allergens may come from grasses, trees, and weeds. This is seasonal allergic rhinitis, or "hay fever." Other allergens cause year-round allergic rhinitis (perennial allergic rhinitis) such as house dust mite allergen, pet dander and mold spores.    SYMPTOMS     Nasal stuffiness (congestion).   Runny, itchy nose with sneezing and tearing of the eyes.   There is often an itching of the mouth, eyes and ears.  It cannot be cured, but it can be controlled with medications.  DIAGNOSIS    If you are unable to determine the offending allergen, skin or blood testing may find it.  TREATMENT     Avoid the allergen.   Medications and allergy shots (immunotherapy) can help.   Hay fever may often be treated with antihistamines in pill or nasal spray forms. Antihistamines block the effects of histamine. There are over-the-counter medicines that may help with nasal congestion and swelling around the eyes. Check with your caregiver before taking or giving this medicine.  If the treatment above does not work, there are many new medications your caregiver can prescribe. Stronger medications may be used if initial measures are ineffective. Desensitizing injections can be used if medications and avoidance fails. Desensitization is when a patient is given ongoing shots until the body becomes less sensitive to the allergen. Make sure you follow up with your caregiver if problems continue.   SEEK MEDICAL CARE IF:     You develop fever (more than 100.5 F (38.1 C).   You develop a cough that does not stop easily (persistent).   You have shortness of breath.   You start wheezing.   Symptoms interfere with normal daily activities.  Document Released: 04/07/2001 Document Revised: 10/05/2011 Document Reviewed: 10/17/2008  [employer] Patient Information 2013 [employer]

## 2012-11-17 NOTE — Progress Notes (Signed)
Subjective:    Patient ID: Cynthia Hendrix, female    DOB: [birth year], 52 y.o.   MRN: [id number]  HPI  Pt presents to the clinic today with c/o allergy symptoms. This started 2 weeks ago and is progressively getting worse. She does have a history of allergies. She has taken Allegra and Veramyst nasal spray. She has also taken Mucinex. The allergy symptoms seem to be getting worse. She has no signs of sinus infection that she can think of. She also c/o fatigue. She is experiencing grief from the recent loss of her mother. She is depressed. She is having difficulty sleeping. She needs something that will help calm her nerves and also help her to sleep. She does not want to take anything that is addictive. Of note, her BP is very elevated today. Of note, the patient has been out of her BP medicine for 3 weeks. With everything going on this past few weeks, she has not had time to call to get her medication refilled. She denies headache, blurred vision, chest tightness, chest pain, shortness of breath or dizziness.  Review of Systems      Past Medical History  Diagnosis Date  . Allergy   . Anemia   . Hypertension     followed by [facility] cardiology  . Headache     Pit. hemmorrhage 2008    Current Outpatient Prescriptions  Medication Sig Dispense Refill  . carvedilol (COREG) 25 MG tablet Take 1 tablet (25 mg total) by mouth 2 (two) times daily with a meal.  180 tablet  2  . cloNIDine (CATAPRES) 0.1 MG tablet Take 1 tablet (0.1 mg total) by mouth 2 (two) times daily.  180 tablet  2  . fluticasone (VERAMYST) 27.5 MCG/SPRAY nasal spray Place 2 sprays into the nose daily as needed.       . Olmesartan-Amlodipine-HCTZ (TRIBENZOR) 40-10-25 MG TABS Take 1 tablet by mouth daily.  70 tablet  0  . mirtazapine (REMERON) 15 MG tablet Take 1 tablet (15 mg total) by mouth at bedtime.  30 tablet  2  . predniSONE (DELTASONE) 10 MG tablet Take 3 tablets on days 1-2, take 2 tablets on days 3-4, take 1 tablet on  days 5-6  12 tablet  0  . venlafaxine (EFFEXOR XR) 37.5 MG 24 hr capsule Take 1 capsule (37.5 mg total) by mouth daily.  60 capsule  5   No current facility-administered medications for this visit.    Allergies  Allergen Reactions  . Amlodipine Besylate     REACTION: constipation    Family History  Problem Relation Age of Onset  . Hypertension Other     History   Social History  . Marital Status: Married    Spouse Name: N/A    Number of Children: N/A  . Years of Education: N/A   Occupational History  . Not on file.   Social History Main Topics  . Smoking status: Current Every Day Smoker -- 1.00 packs/day for 25 years    Types: Cigarettes  . Smokeless tobacco: Never Used  . Alcohol Use: 1.2 oz/week    2 Cans of beer per week  . Drug Use: No  . Sexually Active: Yes    Birth Control/ Protection: Surgical   Other Topics Concern  . Not on file   Social History Narrative  . No narrative on file     Constitutional: Pt reports fatigue and headache. Denies fever, malaise, or abrupt weight changes.  HEENT: Pt reports watery  eyes, runny nose and sore throat. Denies eye pain, eye redness, ear pain, ringing in the ears, wax buildup,  nasal congestion, bloody nose, or sore throat. Respiratory: Denies difficulty breathing, shortness of breath, cough or sputum production.   Cardiovascular: Denies chest pain, chest tightness, palpitations or swelling in the hands or feet.   Neurological: Denies dizziness, difficulty with memory, difficulty with speech or problems with balance and coordination.   No other specific complaints in a complete review of systems (except as listed in HPI above).  Objective:   Physical Exam   BP 210/130  Pulse 80  Temp(Src) 98 F (36.7 C) (Oral)  Wt 175 lb (79.379 kg)  BMI 25.48 kg/m2  SpO2 98% Wt Readings from Last 3 Encounters:  11/17/12 175 lb (79.379 kg)  11/27/11 182 lb (82.555 kg)  11/23/11 182 lb 6.4 oz (82.736 kg)    General:  Appears her stated age, well developed, well nourished in NAD. HEENT: Head: normal shape and size; Eyes: sclera injected, no icterus, conjunctiva pink, PERRLA and EOMs intact; Ears: Tm's gray and intact, normal light reflex; Nose: mucosa boggy and moist, septum midline; Throat/Mouth: Teeth present, mucosa erythematous and moist, + PND, no exudate, lesions or ulcerations noted.  Cardiovascular: Normal rate and rhythm. S1,S2 noted.  No murmur, rubs or gallops noted. No JVD or BLE edema. No carotid bruits noted. Pulmonary/Chest: Normal effort and positive vesicular breath sounds. No respiratory distress. No wheezes, rales or ronchi noted.  Neurological: Alert and oriented. Cranial nerves II-XII intact. Coordination normal. +DTRs bilaterally. Psychiatric: Mood depressed and affect normal. Behavior is normal. Judgment and thought content normal. Pt crying.       Assessment & Plan:

## 2013-11-24 ENCOUNTER — Other Ambulatory Visit: Payer: Self-pay | Admitting: Internal Medicine

## 2013-11-27 ENCOUNTER — Other Ambulatory Visit: Payer: Self-pay | Admitting: Internal Medicine

## 2013-12-07 ENCOUNTER — Ambulatory Visit: Payer: 59 | Admitting: Internal Medicine

## 2013-12-07 DIAGNOSIS — Z0289 Encounter for other administrative examinations: Secondary | ICD-10-CM

## 2013-12-14 ENCOUNTER — Ambulatory Visit (INDEPENDENT_AMBULATORY_CARE_PROVIDER_SITE_OTHER): Payer: 59 | Admitting: Family Medicine

## 2013-12-14 VITALS — BP 182/120 | HR 108 | Temp 98.6°F | Resp 14 | Ht 69.5 in | Wt 174.2 lb

## 2013-12-14 DIAGNOSIS — I10 Essential (primary) hypertension: Secondary | ICD-10-CM

## 2013-12-14 DIAGNOSIS — R609 Edema, unspecified: Secondary | ICD-10-CM

## 2013-12-14 LAB — POCT CBC
Granulocyte percent: 61.7 %G (ref 37–80)
HCT, POC: 40.6 % (ref 37.7–47.9)
Hemoglobin: 12.5 g/dL (ref 12.2–16.2)
Lymph, poc: 2 (ref 0.6–3.4)
MCH, POC: 28.4 pg (ref 27–31.2)
MCHC: 30.8 g/dL — AB (ref 31.8–35.4)
MCV: 92.3 fL (ref 80–97)
MID (cbc): 0.5 (ref 0–0.9)
MPV: 10.7 fL (ref 0–99.8)
POC Granulocyte: 4 (ref 2–6.9)
POC LYMPH PERCENT: 31.2 %L (ref 10–50)
POC MID %: 7.1 %M (ref 0–12)
Platelet Count, POC: 256 10*3/uL (ref 142–424)
RBC: 4.4 M/uL (ref 4.04–5.48)
RDW, POC: 14.3 %
WBC: 6.5 10*3/uL (ref 4.6–10.2)

## 2013-12-14 MED ORDER — CARVEDILOL 25 MG PO TABS: 25.0000 mg | ORAL_TABLET | Freq: Two times a day (BID) | ORAL | Status: DC

## 2013-12-14 MED ORDER — OLMESARTAN-AMLODIPINE-HCTZ 40-10-25 MG PO TABS: 1.0000 | ORAL_TABLET | Freq: Every day | ORAL | Status: DC

## 2013-12-14 MED ORDER — FUROSEMIDE 40 MG PO TABS: 40.0000 mg | ORAL_TABLET | Freq: Every day | ORAL | Status: DC

## 2013-12-14 NOTE — Progress Notes (Signed)
Subjective:    Patient ID: Cynthia Hendrix, female    DOB: 04/30/1961, 53 y.o.   MRN: [id number] This chart was scribed for Elvina SidleKurt Lauenstein, MD by Valera CastleSteven Perry, ED Scribe. This patient was seen in room 01 and the patient's care was started at 7:44 PM.  Chief Complaint  Patient presents with   Fatigue    after vitamins wear off    Leg Swelling    been swellling for past couple of weeks, but goes down, but not since yesterday   Insomnia    off and on x 3 months   HPI Cynthia Hendrix is a 53 y.o. female Pt presents with waxing and waning, bilateral LE swelling in her feet, onset several weeks ago, constant since yesterday.   She denies being on Prednisone currently. She takes Tribenzor and Coreg CR for her BP, prescribed by Dr. Yetta BarreJones [facility].   She reports intermittent post-nasal drip in the mornings. She denies dysuria, headache, cough.   She reports intermittent insomnia, onset 3 months ago. She reports her nares will burn and her jaw will ache when she tries to sleep. She states she thought her symptoms were due to allergies at first.    She reports h/o hysterectomy due to abnormal bleeding. She reports dryness at baseline ever since her surgery.   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   PCP - Sanda Lingerhomas Jones, MD  Patient Active Problem List   Diagnosis Date Noted   Malignant hypertension 11/27/2011   Snoring 11/27/2011   Other screening mammogram 08/07/2011   Depression with anxiety 08/07/2011   ALLERGIC RHINITIS 10/09/2009   TOBACCO USE 06/26/2009   Past Surgical History  Procedure Laterality Date   Abdominal hysterectomy      Prior to Admission medications   Medication Sig Start Date End Date Taking? Authorizing Provider  Olmesartan-Amlodipine-HCTZ (TRIBENZOR) 40-10-25 MG TABS Take 1 tablet by mouth daily. 11/17/12  Yes Nicki Reaperegina Baity, NP  fluticasone (VERAMYST) 27.5 MCG/SPRAY nasal spray Place 2 sprays into the nose daily as needed.      Historical Provider, MD   Review of Systems  Constitutional: Negative for fever.  HENT: Positive for postnasal drip.   Respiratory: Negative for cough.   Cardiovascular: Positive for leg swelling (bilateral feet).  Genitourinary: Negative for dysuria.  Allergic/Immunologic: Positive for environmental allergies.  Neurological: Negative for headaches.  Psychiatric/Behavioral: Positive for sleep disturbance.      Objective:   Physical Exam BP 182/120   Pulse 108   Temp(Src) 98.6 F (37 C) (Oral)   Resp 14   Ht 5' 9.5" (1.765 m)   Wt 174 lb 3.2 oz (79.017 kg)   BMI 25.36 kg/m2   SpO2 98%  Nursing note and vitals reviewed. Constitutional: He is oriented to person, place, and time. He appears well-developed and well-nourished. No distress.  HENT:  Head: Normocephalic and atraumatic.  Eyes: EOM are normal.  Neck: Neck supple.  Cardiovascular: Normal rate.   Pulmonary/Chest: Effort normal. No respiratory distress.  Musculoskeletal: Normal range of motion.  Neurological: He is alert and oriented to person, place, and time.  Skin: Skin is warm and dry.  Psychiatric: He has a normal mood and affect. His behavior is normal.     Results for orders placed in visit on 12/14/13  POCT CBC      Result Value Ref Range   WBC 6.5  4.6 - 10.2 K/uL   Lymph, poc 2.0  0.6 - 3.4  POC LYMPH PERCENT 31.2  10 - 50 %L   MID (cbc) 0.5  0 - 0.9   POC MID % 7.1  0 - 12 %M   POC Granulocyte 4.0  2 - 6.9   Granulocyte percent 61.7  37 - 80 %G   RBC 4.40  4.04 - 5.48 M/uL   Hemoglobin 12.5  12.2 - 16.2 g/dL   HCT, POC 09.840.6  11.937.7 - 47.9 %   MCV 92.3  80 - 97 fL   MCH, POC 28.4  27 - 31.2 pg   MCHC 30.8 (*) 31.8 - 35.4 g/dL   RDW, POC 14.714.3     Platelet Count, POC 256  142 - 424 K/uL   MPV 10.7  0 - 99.8 fL    Assessment & Plan:   Accelerated hypertension - Plan: Olmesartan-Amlodipine-HCTZ (TRIBENZOR) 40-10-25 MG TABS, furosemide (LASIX) 40 MG tablet, carvedilol (COREG) 25 MG tablet, DISCONTINUED:  Olmesartan-Amlodipine-HCTZ (TRIBENZOR) 40-10-25 MG TABS, DISCONTINUED: carvedilol (COREG) 25 MG tablet, DISCONTINUED: furosemide (LASIX) 40 MG tablet  Malignant hypertension - Plan: Comprehensive metabolic panel  Edema - Plan: POCT CBC  Signed, Elvina SidleKurt Lauenstein, MD

## 2013-12-15 LAB — COMPREHENSIVE METABOLIC PANEL
ALT: 14 U/L (ref 0–35)
AST: 11 U/L (ref 0–37)
Albumin: 4.1 g/dL (ref 3.5–5.2)
Alkaline Phosphatase: 65 U/L (ref 39–117)
BUN: 11 mg/dL (ref 6–23)
CO2: 24 mEq/L (ref 19–32)
Calcium: 9.1 mg/dL (ref 8.4–10.5)
Chloride: 107 mEq/L (ref 96–112)
Creat: 0.7 mg/dL (ref 0.50–1.10)
Glucose, Bld: 106 mg/dL — ABNORMAL HIGH (ref 70–99)
Potassium: 4.1 mEq/L (ref 3.5–5.3)
Sodium: 141 mEq/L (ref 135–145)
Total Bilirubin: 0.4 mg/dL (ref 0.2–1.2)
Total Protein: 6.7 g/dL (ref 6.0–8.3)

## 2013-12-20 ENCOUNTER — Ambulatory Visit (INDEPENDENT_AMBULATORY_CARE_PROVIDER_SITE_OTHER): Payer: 59 | Admitting: Internal Medicine

## 2013-12-20 ENCOUNTER — Encounter: Payer: Self-pay | Admitting: Internal Medicine

## 2013-12-20 VITALS — BP 200/104 | HR 89 | Temp 98.6°F | Resp 16 | Ht 69.0 in | Wt 167.0 lb

## 2013-12-20 DIAGNOSIS — R0609 Other forms of dyspnea: Secondary | ICD-10-CM

## 2013-12-20 DIAGNOSIS — R0683 Snoring: Secondary | ICD-10-CM

## 2013-12-20 DIAGNOSIS — R0989 Other specified symptoms and signs involving the circulatory and respiratory systems: Secondary | ICD-10-CM

## 2013-12-20 DIAGNOSIS — F17200 Nicotine dependence, unspecified, uncomplicated: Secondary | ICD-10-CM

## 2013-12-20 DIAGNOSIS — I10 Essential (primary) hypertension: Secondary | ICD-10-CM

## 2013-12-20 MED ORDER — OLMESARTAN MEDOXOMIL-HCTZ 40-25 MG PO TABS: 1.0000 | ORAL_TABLET | Freq: Every day | ORAL | Status: DC

## 2013-12-20 NOTE — Patient Instructions (Signed)

## 2013-12-20 NOTE — Progress Notes (Signed)
Subjective:    Patient ID: Cynthia Hendrix, female    DOB: Oct 03, 1960, 53 y.o.   MRN: [id number]  Hypertension This is a chronic problem. The current episode started more than 1 year ago. The problem has been gradually worsening since onset. The problem is uncontrolled. Pertinent negatives include no anxiety, blurred vision, chest pain, headaches, malaise/fatigue, neck pain, orthopnea, palpitations, peripheral edema, PND, shortness of breath or sweats. Agents associated with hypertension include NSAIDs. Risk factors for coronary artery disease include smoking/tobacco exposure. Past treatments include beta blockers, calcium channel blockers, diuretics and angiotensin blockers. The current treatment provides moderate improvement. Compliance problems include medication side effects (amlodipine causes painful ankle edema).       Review of Systems  Constitutional: Negative.  Negative for fever, chills, malaise/fatigue, diaphoresis, appetite change and fatigue.  HENT: Negative.   Eyes: Negative.  Negative for blurred vision.  Respiratory: Negative.  Negative for cough, choking, chest tightness, shortness of breath, wheezing and stridor.   Cardiovascular: Positive for leg swelling (ankles). Negative for chest pain, palpitations, orthopnea and PND.  Gastrointestinal: Negative.  Negative for nausea, vomiting, abdominal pain, diarrhea, constipation and blood in stool.  Endocrine: Negative.   Genitourinary: Negative.   Musculoskeletal: Negative.  Negative for arthralgias, back pain, myalgias and neck pain.  Skin: Negative.   Allergic/Immunologic: Negative.   Neurological: Positive for dizziness. Negative for tremors, seizures, syncope, facial asymmetry, speech difficulty, weakness, light-headedness, numbness and headaches.  Hematological: Negative.  Negative for adenopathy. Does not bruise/bleed easily.  Psychiatric/Behavioral: Negative.        Objective:   Physical Exam  Vitals  reviewed. Constitutional: She is oriented to person, place, and time. She appears well-developed and well-nourished. No distress.  HENT:  Head: Normocephalic and atraumatic.  Mouth/Throat: Oropharynx is clear and moist. No oropharyngeal exudate.  Eyes: Conjunctivae are normal. Right eye exhibits no discharge. Left eye exhibits no discharge. No scleral icterus.  Neck: Normal range of motion. Neck supple. No JVD present. No tracheal deviation present. No thyromegaly present.  Cardiovascular: Normal rate, regular rhythm, normal heart sounds and intact distal pulses.  Exam reveals no gallop and no friction rub.   No murmur heard. Pulmonary/Chest: Effort normal and breath sounds normal. No stridor. No respiratory distress. She has no wheezes. She has no rales. She exhibits no tenderness.  Abdominal: Soft. Bowel sounds are normal. She exhibits no distension and no mass. There is no tenderness. There is no rebound and no guarding.  Musculoskeletal: Normal range of motion. She exhibits edema (trace ankle edema). She exhibits no tenderness.  Lymphadenopathy:    She has no cervical adenopathy.  Neurological: She is oriented to person, place, and time.  Skin: Skin is warm and dry. No rash noted. She is not diaphoretic. No erythema. No pallor.  Psychiatric: She has a normal mood and affect. Her behavior is normal. Judgment and thought content normal.     Lab Results  Component Value Date   WBC 6.5 12/14/2013   HGB 12.5 12/14/2013   HCT 40.6 12/14/2013   PLT 268.0 11/27/2011   GLUCOSE 106* 12/14/2013   CHOL 172 08/07/2011   TRIG 123.0 08/07/2011   HDL 39.80 08/07/2011   LDLCALC 108* 08/07/2011   ALT 14 12/14/2013   AST 11 12/14/2013   NA 141 12/14/2013   K 4.1 12/14/2013   CL 107 12/14/2013   CREATININE 0.70 12/14/2013   BUN 11 12/14/2013   CO2 24 12/14/2013   TSH 1.99 11/27/2011  Assessment & Plan:

## 2013-12-20 NOTE — Progress Notes (Signed)
Pre visit review using our clinic review tool, if applicable. No additional management support is needed unless otherwise documented below in the visit note. 

## 2013-12-20 NOTE — Assessment & Plan Note (Signed)
She agrees to try to quit smoking 

## 2013-12-20 NOTE — Assessment & Plan Note (Signed)
Sleep med referral 

## 2013-12-20 NOTE — Assessment & Plan Note (Addendum)
She will stop taking nsaids and will stop smoking I have asked her to be tested for OSA She needs to stop the CCB due to ankle edema Will control the BP with Benicar-HCT and coreg

## 2014-01-08 ENCOUNTER — Ambulatory Visit: Payer: 59 | Admitting: Internal Medicine

## 2014-01-15 ENCOUNTER — Ambulatory Visit (INDEPENDENT_AMBULATORY_CARE_PROVIDER_SITE_OTHER): Payer: 59 | Admitting: Internal Medicine

## 2014-01-15 ENCOUNTER — Encounter: Payer: Self-pay | Admitting: Internal Medicine

## 2014-01-15 VITALS — BP 212/116 | HR 80 | Temp 99.0°F | Resp 16 | Wt 171.0 lb

## 2014-01-15 DIAGNOSIS — E236 Other disorders of pituitary gland: Secondary | ICD-10-CM | POA: Insufficient documentation

## 2014-01-15 DIAGNOSIS — I10 Essential (primary) hypertension: Secondary | ICD-10-CM

## 2014-01-15 DIAGNOSIS — F17200 Nicotine dependence, unspecified, uncomplicated: Secondary | ICD-10-CM

## 2014-01-15 DIAGNOSIS — F418 Other specified anxiety disorders: Secondary | ICD-10-CM

## 2014-01-15 DIAGNOSIS — R609 Edema, unspecified: Secondary | ICD-10-CM | POA: Insufficient documentation

## 2014-01-15 DIAGNOSIS — F341 Dysthymic disorder: Secondary | ICD-10-CM

## 2014-01-15 MED ORDER — TRIAMTERENE-HCTZ 75-50 MG PO TABS: 1.0000 | ORAL_TABLET | Freq: Every day | ORAL | Status: DC

## 2014-01-15 MED ORDER — FUROSEMIDE 40 MG PO TABS: 40.0000 mg | ORAL_TABLET | Freq: Every day | ORAL | Status: DC | PRN

## 2014-01-15 MED ORDER — AZILSARTAN MEDOXOMIL 80 MG PO TABS: 1.0000 | ORAL_TABLET | Freq: Every day | ORAL | Status: DC

## 2014-01-15 NOTE — Progress Notes (Signed)
Pre visit review using our clinic review tool, if applicable. No additional management support is needed unless otherwise documented below in the visit note. 

## 2014-01-15 NOTE — Assessment & Plan Note (Addendum)
See Rx Lasix prn

## 2014-01-15 NOTE — Assessment & Plan Note (Signed)
6/15 job stress Off work x 1 wk

## 2014-01-15 NOTE — Patient Instructions (Signed)
Stop smoking please!

## 2014-01-15 NOTE — Assessment & Plan Note (Addendum)
See meds: start Edarbi 80 and Maxzide 50/75 Cont Coreg D/c Benicar HCT Off work x 1 wk RTC 1 wk [employer]

## 2014-01-15 NOTE — Assessment & Plan Note (Addendum)
2006 Dr Venetia MaxonStern  DATE OF ADMISSION: 05/06/2005  DATE OF DISCHARGE: 05/11/2005  DISCHARGE SUMMARY  REASON FOR ADMISSION:  1. Pituitary neoplasm.  2. Hypertension.  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  4. Tobacco use disorder.  FINAL DIAGNOSES:  1. Pituitary neoplasm.  2. Hypertension.  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  4. Tobacco use disorder.  HISTORY OF ILLNESS AND HOSPITAL COURSE: Ernestina PennaGloria Picker is a 53 year old woman  who had complained of blurring of vision and facial numbness with associated  nausea and vomiting, and was transferred from [facility] on  May 06, 2005 for pituitary apoplexy confirmed with hemorrhagic pituitary  mass on an MRI of the brain. She has uncontrolled hypertension.  The patient was admitted to the hospital and was taken straight to the  operating room, where she underwent transsphenoidal resection of pituitary  adenoma with an abdominal fat graft placed; this was done by Dr. Venetia MaxonStern and  Dr. Ezzard StandingNewman from [facility]. Postoperatively, she had significant  improvement in ptosis of the left lid and also blurring and double vision.  She gradually made significant improvement, was doing well on the 15th and  was transferred to the floor. On the 16th, she was doing well and had some  occasional leg cramps, otherwise, no significant problems and was discharged  home on oral hydrocortisone and blood pressure medications prescribed with  the assistance of the Critical Care Medicine Service

## 2014-01-15 NOTE — Assessment & Plan Note (Signed)
Refractory Discussed 

## 2014-01-15 NOTE — Progress Notes (Signed)
Subjective:     Hypertension This is a chronic problem. Pertinent negatives include no headaches.   DATE OF ADMISSION: 05/06/2005  DATE OF DISCHARGE: 05/11/2005  DISCHARGE SUMMARY  REASON FOR ADMISSION:  1. Pituitary neoplasm.  2. Hypertension.  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  4. Tobacco use disorder.  FINAL DIAGNOSES:  1. Pituitary neoplasm.  2. Hypertension.  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  4. Tobacco use disorder.  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 She has uncontrolled hypertension.  The patient was admitted to the hospital and was taken straight to the  operating room, where she underwent transsphenoidal resection of pituitary  adenoma with an abdominal fat graft placed; this was done by Dr. Venetia MaxonStern and  Dr. Ezzard StandingNewman from [facility]. Postoperatively, she had significant  improvement in ptosis of the left lid and also blurring and double vision.  She gradually made significant improvement, was doing well on the 15th and  was transferred to the floor. On the 16th, she was doing well and had some  occasional leg cramps, otherwise, no significant problems and was discharged  home on oral hydrocortisone and blood pressure medications prescribed with  the assistance of the Critical Care Medicine Service  BP Readings from Last 3 Encounters:  01/15/14 212/116  12/20/13 200/104  12/14/13 182/120    Review of Systems  Constitutional: Negative for chills, activity change, appetite change, fatigue and unexpected weight change.  HENT: Negative for congestion, mouth sores and sinus pressure.   Eyes: Negative for visual disturbance.  Respiratory:  Negative for cough and chest tightness.   Cardiovascular: Positive for leg swelling.  Gastrointestinal: Negative for nausea and abdominal pain.  Genitourinary: Negative for frequency, difficulty urinating and vaginal pain.  Musculoskeletal: Negative for back pain and gait problem.  Skin: Negative for pallor and rash.  Neurological: Negative for dizziness, tremors, weakness, numbness and headaches.  Psychiatric/Behavioral: Negative for suicidal ideas, confusion and sleep disturbance. The patient is nervous/anxious.        Objective:   Physical Exam  Constitutional: She appears well-developed. No distress.  HENT:  Head: Normocephalic.  Right Ear: External ear normal.  Left Ear: External ear normal.  Nose: Nose normal.  Mouth/Throat: Oropharynx is clear and moist.  Eyes: Conjunctivae are normal. Pupils are equal, round, and reactive to light. Right eye exhibits no discharge. Left eye exhibits no discharge.  Neck: Normal range of motion. Neck supple. No JVD present. No tracheal deviation present. No thyromegaly present.  Cardiovascular: Normal rate, regular rhythm and normal heart sounds.   Pulmonary/Chest: No stridor. No respiratory distress. She has no wheezes.  Abdominal: Soft. Bowel sounds are normal. She exhibits no distension and no mass. There is no tenderness. There is no rebound and no guarding.  Musculoskeletal: She exhibits edema (B trace). She exhibits no tenderness.  Lymphadenopathy:    She has no cervical adenopathy.  Neurological: She displays normal reflexes. No cranial nerve deficit. She exhibits normal muscle tone. Coordination normal.  Skin: No rash noted. No erythema.  Psychiatric: She has a normal mood and affect. Her behavior is normal. Judgment and thought content normal.  tearful   Chart reviewed  A complex case  Assessment:         Plan:

## 2014-01-25 ENCOUNTER — Ambulatory Visit (HOSPITAL_COMMUNITY): Payer: 59 | Attending: Internal Medicine | Admitting: Cardiology

## 2014-01-25 DIAGNOSIS — I10 Essential (primary) hypertension: Secondary | ICD-10-CM

## 2014-01-25 NOTE — Progress Notes (Signed)
Renal artery duplex performed  

## 2014-02-05 ENCOUNTER — Telehealth: Payer: Self-pay | Admitting: Internal Medicine

## 2014-02-05 NOTE — Telephone Encounter (Signed)
Pt request test result that was done on 01/25/14 for Dr. Macario GoldsPlot. Please call pt, she never heard anything from our office.

## 2014-02-05 NOTE — Telephone Encounter (Signed)
Pt informed of Renal Artery Duplex results and transferred to scheduler to make OV. I advised her to do labs that Dr. Posey ReaPlotnikov ordered at [facility].

## 2014-02-07 ENCOUNTER — Telehealth: Payer: Self-pay | Admitting: Internal Medicine

## 2014-02-07 NOTE — Telephone Encounter (Signed)
Pt call back request refill for Edarbi for swollen feet. Please advise.

## 2014-02-08 ENCOUNTER — Other Ambulatory Visit: Payer: Self-pay

## 2014-02-08 ENCOUNTER — Other Ambulatory Visit: Payer: Self-pay | Admitting: Internal Medicine

## 2014-02-08 DIAGNOSIS — I10 Essential (primary) hypertension: Secondary | ICD-10-CM

## 2014-02-08 MED ORDER — LOSARTAN POTASSIUM 100 MG PO TABS: 100.0000 mg | ORAL_TABLET | Freq: Every day | ORAL | Status: DC

## 2014-02-08 MED ORDER — AZILSARTAN MEDOXOMIL 80 MG PO TABS: 1.0000 | ORAL_TABLET | Freq: Every day | ORAL | Status: DC

## 2014-02-08 NOTE — Telephone Encounter (Signed)
RX approved and pharmacy notified.

## 2014-02-09 ENCOUNTER — Ambulatory Visit (INDEPENDENT_AMBULATORY_CARE_PROVIDER_SITE_OTHER): Payer: 59 | Admitting: Internal Medicine

## 2014-02-09 ENCOUNTER — Encounter: Payer: Self-pay | Admitting: Internal Medicine

## 2014-02-09 VITALS — BP 218/110 | HR 88 | Temp 98.3°F | Resp 16 | Wt 172.0 lb

## 2014-02-09 DIAGNOSIS — F17200 Nicotine dependence, unspecified, uncomplicated: Secondary | ICD-10-CM

## 2014-02-09 DIAGNOSIS — F341 Dysthymic disorder: Secondary | ICD-10-CM

## 2014-02-09 DIAGNOSIS — F418 Other specified anxiety disorders: Secondary | ICD-10-CM

## 2014-02-09 DIAGNOSIS — I10 Essential (primary) hypertension: Secondary | ICD-10-CM

## 2014-02-09 DIAGNOSIS — R609 Edema, unspecified: Secondary | ICD-10-CM

## 2014-02-09 MED ORDER — AZILSARTAN MEDOXOMIL 80 MG PO TABS: 1.0000 | ORAL_TABLET | Freq: Every day | ORAL | Status: DC

## 2014-02-09 MED ORDER — TRIAMTERENE-HCTZ 75-50 MG PO TABS: 1.0000 | ORAL_TABLET | Freq: Every day | ORAL | Status: DC

## 2014-02-09 MED ORDER — FUROSEMIDE 40 MG PO TABS: 40.0000 mg | ORAL_TABLET | Freq: Every day | ORAL | Status: DC | PRN

## 2014-02-09 NOTE — Progress Notes (Signed)
Pre visit review using our clinic review tool, if applicable. No additional management support is needed unless otherwise documented below in the visit note. 

## 2014-02-09 NOTE — Assessment & Plan Note (Signed)
Pt has not started Maxzide yet -- asked to start Lasix prn

## 2014-02-09 NOTE — Assessment & Plan Note (Signed)
Better  

## 2014-02-09 NOTE — Assessment & Plan Note (Addendum)
BP was controlled well on Edarbi only-- SBP150 (ran out) ?needs PA -- will re-start  Pt has not started Maxzide yet -- asked to start  (Losartan - she has not started yet)  RTC in 1 mo Vickie Epley/labs

## 2014-02-09 NOTE — Assessment & Plan Note (Signed)
Quit 6/15!

## 2014-02-09 NOTE — Progress Notes (Signed)
  Subjective:   C/o high BP. Cynthia Hendrix has run out of [place]. ?needed a PA. Losartan and Maxzide were called in - she has not started yet. BP was good on Edarbi - SBP 150. C/o LE swelling - worse. She stopped smoking!  Hypertension This is a chronic problem. Pertinent negatives include no headaches.   PMHx: 1. Pituitary neoplasm.  2. Hypertension.  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  4. Tobacco use disorder.    BP Readings from Last 3 Encounters:  02/09/14 218/110  01/15/14 212/116  12/20/13 200/104    Review of Systems  Constitutional: Negative for chills, activity change, appetite change, fatigue and unexpected weight change.  HENT: Negative for congestion, mouth sores and sinus pressure.   Eyes: Negative for visual disturbance.  Respiratory: Negative for cough and chest tightness.   Cardiovascular: Positive for leg swelling.  Gastrointestinal: Negative for nausea and abdominal pain.  Genitourinary: Negative for frequency, difficulty urinating and vaginal pain.  Musculoskeletal: Negative for back pain and gait problem.  Skin: Negative for pallor and rash.  Neurological: Negative for dizziness, tremors, weakness, numbness and headaches.  Psychiatric/Behavioral: Negative for suicidal ideas, confusion and sleep disturbance. The patient is not nervous/anxious.        Objective:   Physical Exam  Constitutional: She appears well-developed. No distress.  HENT:  Head: Normocephalic.  Right Ear: External ear normal.  Left Ear: External ear normal.  Nose: Nose normal.  Mouth/Throat: Oropharynx is clear and moist.  Eyes: Conjunctivae are normal. Pupils are equal, round, and reactive to light. Right eye exhibits no discharge. Left eye exhibits no discharge.  Neck: Normal range of motion. Neck supple. No JVD present. No tracheal deviation present. No thyromegaly present.  Cardiovascular: Normal rate, regular rhythm and normal heart sounds.   Pulmonary/Chest: No  stridor. No respiratory distress. She has no wheezes.  Abdominal: Soft. Bowel sounds are normal. She exhibits no distension and no mass. There is no tenderness. There is no rebound and no guarding.  Musculoskeletal: She exhibits edema (B 1+). She exhibits no tenderness.  Lymphadenopathy:    She has no cervical adenopathy.  Neurological: She displays normal reflexes. No cranial nerve deficit. She exhibits normal muscle tone. Coordination normal.  Skin: No rash noted. No erythema.  Psychiatric: She has a normal mood and affect. Her behavior is normal. Judgment and thought content normal.   Chart reviewed   Lab Results  Component Value Date   WBC 6.5 12/14/2013   HGB 12.5 12/14/2013   HCT 40.6 12/14/2013   PLT 268.0 11/27/2011   GLUCOSE 106* 12/14/2013   CHOL 172 08/07/2011   TRIG 123.0 08/07/2011   HDL 39.80 08/07/2011   LDLCALC 108* 08/07/2011   ALT 14 12/14/2013   AST 11 12/14/2013   NA 141 12/14/2013   K 4.1 12/14/2013   CL 107 12/14/2013   CREATININE 0.70 12/14/2013   BUN 11 12/14/2013   CO2 24 12/14/2013   TSH 1.99 11/27/2011       Assessment:         Plan:

## 2014-02-12 ENCOUNTER — Telehealth: Payer: Self-pay

## 2014-02-12 MED ORDER — OLMESARTAN MEDOXOMIL 40 MG PO TABS: 40.0000 mg | ORAL_TABLET | Freq: Every day | ORAL | Status: DC

## 2014-02-12 NOTE — Telephone Encounter (Signed)
Received pharmacy rejection stating that insurance will not cover edarbi without a prior authorization. Preferred alternatives are  benicar  . Please advise. Thanks

## 2014-02-12 NOTE — Telephone Encounter (Signed)
Pt notified and states she does not understand why she received this call. She states that this was already taken care of at office visit last week and the" the right hand does not know what the left hand is doing". I apologized for the call and advised of the rejection received from pharmacy.

## 2014-02-12 NOTE — Telephone Encounter (Signed)
Ok Benicar 40 mg qd. Give a card pls Thx

## 2014-03-14 ENCOUNTER — Ambulatory Visit: Payer: 59 | Admitting: Internal Medicine

## 2014-03-29 ENCOUNTER — Ambulatory Visit (INDEPENDENT_AMBULATORY_CARE_PROVIDER_SITE_OTHER): Payer: 59

## 2014-03-29 ENCOUNTER — Ambulatory Visit (INDEPENDENT_AMBULATORY_CARE_PROVIDER_SITE_OTHER): Payer: 59 | Admitting: Family Medicine

## 2014-03-29 VITALS — BP 190/100 | HR 97 | Temp 98.3°F | Resp 18 | Ht 69.5 in | Wt 174.4 lb

## 2014-03-29 DIAGNOSIS — S99919A Unspecified injury of unspecified ankle, initial encounter: Secondary | ICD-10-CM

## 2014-03-29 DIAGNOSIS — S8990XA Unspecified injury of unspecified lower leg, initial encounter: Secondary | ICD-10-CM

## 2014-03-29 DIAGNOSIS — IMO0001 Reserved for inherently not codable concepts without codable children: Secondary | ICD-10-CM

## 2014-03-29 DIAGNOSIS — M79609 Pain in unspecified limb: Secondary | ICD-10-CM

## 2014-03-29 DIAGNOSIS — S99929A Unspecified injury of unspecified foot, initial encounter: Secondary | ICD-10-CM

## 2014-03-29 DIAGNOSIS — I10 Essential (primary) hypertension: Secondary | ICD-10-CM

## 2014-03-29 DIAGNOSIS — IMO0002 Reserved for concepts with insufficient information to code with codable children: Secondary | ICD-10-CM

## 2014-03-29 DIAGNOSIS — S99921A Unspecified injury of right foot, initial encounter: Secondary | ICD-10-CM

## 2014-03-29 MED ORDER — DOXYCYCLINE HYCLATE 100 MG PO CAPS: 100.0000 mg | ORAL_CAPSULE | Freq: Two times a day (BID) | ORAL | Status: DC

## 2014-03-29 MED ORDER — HYDROCODONE-ACETAMINOPHEN 5-325 MG PO TABS: 1.0000 | ORAL_TABLET | Freq: Four times a day (QID) | ORAL | Status: DC | PRN

## 2014-03-29 NOTE — Progress Notes (Addendum)
Subjective:  This chart was scribed for Cynthia Sorenson, MD by Evon Slack, ED Scribe. This Patient was seen in room 02 and the patients care was started at 7:53 PM   Patient ID: Cynthia Hendrix, female    DOB: 29-Sep-1960, 53 y.o.   MRN: [id number]  Chief Complaint  Patient presents with  . Toe Pain    Rt 2-nd toe injury x 5-6 days ago - bruised and swollen  . Recurrent Skin Infections    x 1 week on Rt Arm    Toe Pain  Pertinent negatives include no numbness.   HPI Comments: Cynthia Hendrix is a 53 y.o. female who presents to the [facility] complaining of right 2nd toe pain onset 6 days prior. She states that the toe is bruised and swollen. She states she hit her toe on the sofa. She doesn't report any numbness or tingling. She states she has been keeping the toe elevated with no relief. She states she has been taking tylenol with no relief. She states that nothing provides relief of the swelling.   She is also complaining of abscess on right arm onset 1 week prior. She states that it recently worsened last night. She states she tried to drain it her self last night with no relief.    Past Medical History  Diagnosis Date  . Allergy   . Anemia   . Hypertension     followed by [facility] cardiology  . [id number](308.6)     Pit. hemmorrhage 2008   Current Outpatient Prescriptions on File Prior to Visit  Medication Sig Dispense Refill  . carvedilol (COREG) 25 MG tablet Take 1 tablet (25 mg total) by mouth 2 (two) times daily with a meal.  180 tablet  3  . triamterene-hydrochlorothiazide (MAXZIDE) 75-50 MG per tablet Take 1 tablet by mouth daily.  90 tablet  3  . olmesartan (BENICAR) 40 MG tablet Take 1 tablet (40 mg total) by mouth daily.  90 tablet  3   No current facility-administered medications on file prior to visit.   Allergies  Allergen Reactions  . Clonidine Derivatives     drowsiness  . Amlodipine Besylate     REACTION: constipation   Review of Systems    Constitutional: Positive for activity change. Negative for fever, chills and unexpected weight change.  Musculoskeletal: Positive for arthralgias, gait problem, joint swelling and myalgias. Negative for back pain.  Skin: Positive for color change, rash and wound.  Neurological: Negative for weakness and numbness.   Objective:   BP 190/100  Pulse 97  Temp(Src) 98.3 F (36.8 C) (Oral)  Resp 18  Ht 5' 9.5" (1.765 m)  Wt 174 lb 6.4 oz (79.107 kg)  BMI 25.39 kg/m2  SpO2 99%   Physical Exam  Nursing note and vitals reviewed. Constitutional: She is oriented to person, place, and time. She appears well-developed and well-nourished. No distress.  HENT:  Head: Normocephalic and atraumatic.  Eyes: Conjunctivae and EOM are normal.  Neck: Neck supple. No tracheal deviation present.  Cardiovascular: Normal rate.   Pulses:      Dorsalis pedis pulses are 2+ on the right side, and 2+ on the left side.       Posterior tibial pulses are 2+ on the right side, and 2+ on the left side.  Pulmonary/Chest: Effort normal. No respiratory distress.  Musculoskeletal: Normal range of motion. She exhibits tenderness.  moderate swelling through out 2-5 toes, mild pain over proximal aspect of  4th meta phalanx  and distal of 3rd and 2nd with swelling over 2nd distal aspects of 2nd meta phalanx  sever tenderness of palpation of 2nd phalanx.   Neurological: She is alert and oriented to person, place, and time.  Skin: Skin is warm and dry.  2cm erythematic papule with central pustule 5cm diameter area of tenderness and fluctuance   Psychiatric: She has a normal mood and affect. Her behavior is normal.   [facility] reading (PRIMARY) by  Dr. Clelia Croft. Rt foot: No acute abnormality  Assessment & Plan:   Pain in right extremity, unspecified extremity - Plan: DG Foot Complete Right  Malignant hypertension - pts reports home BP have been MUCH better - up today due to pain but is often higher.  Foot injury, right, initial  encounter - sprain - placed in post-op shoe, RICE. RTC if still sig pain or any gait abnormality in 2 wks. When pain decreased and gait nml, ok to transition thick firm soled shoe and gradually back to reg shoes.  RTC if worsens  Cellulitis and abscess of upper arm and forearm - Plan: Wound culture - gets recurrently but has not requried I&D in many years, may want to have her and her husband try hibiclens and bactroban eradication if sxs cont.  Meds ordered this encounter  Medications  . HYDROcodone-acetaminophen (NORCO/VICODIN) 5-325 MG per tablet    Sig: Take 1-2 tablets by mouth every 6 (six) hours as needed for moderate pain.    Dispense:  40 tablet    Refill:  0  . doxycycline (VIBRAMYCIN) 100 MG capsule    Sig: Take 1 capsule (100 mg total) by mouth 2 (two) times daily.    Dispense:  20 capsule    Refill:  0    I personally performed the services described in this documentation, which was scribed in my presence. The recorded information has been reviewed and considered, and addended by me as needed.  Cynthia Sorenson, MD MPH

## 2014-03-29 NOTE — Progress Notes (Signed)
Procedure: Consent obtained. Local anesthesia with 2% lidocaine.  #11 blade used to make 1 cm incision into the center of the induration. Minimal purulence expressed.  Very small amount of packing placed to keep wound opened and drsg placed.  I expect the packing to fall out within 48h but if it does not and the abscess appears better she will pull out the packing in 48h.  She will keep it covered until it is completed healed.

## 2014-03-29 NOTE — Patient Instructions (Addendum)
OK to wash with soap and water, change dressing once to twice daily. Try to leave the packing in place until your follow-up visit in 2 days - if it comes out prior, do wet warm compresses every few hours as frequently as you can so that the incision stays open and continues draining.  Abscess Care After An abscess (also called a boil or furuncle) is an infected area that contains a collection of pus. Signs and symptoms of an abscess include pain, tenderness, redness, or hardness, or you may feel a moveable soft area under your skin. An abscess can occur anywhere in the body. The infection may spread to surrounding tissues causing cellulitis. A cut (incision) by the surgeon was made over your abscess and the pus was drained out. Gauze may have been packed into the space to provide a drain that will allow the cavity to heal from the inside outwards. The boil may be painful for 5 to 7 days. Most people with a boil do not have high fevers. Your abscess, if seen early, may not have localized, and may not have been lanced. If not, another appointment may be required for this if it does not get better on its own or with medications. HOME CARE INSTRUCTIONS   Only take over-the-counter or prescription medicines for pain, discomfort, or fever as directed by your caregiver.  When you bathe, soak and then remove gauze or iodoform packs at least daily or as directed by your caregiver. You may then wash the wound gently with mild soapy water. Repack with gauze or do as your caregiver directs. SEEK IMMEDIATE MEDICAL CARE IF:   You develop increased pain, swelling, redness, drainage, or bleeding in the wound site.  You develop signs of generalized infection including muscle aches, chills, fever, or a general ill feeling.  An oral temperature above 102 F (38.9 C) develops, not controlled by medication. See your caregiver for a recheck if you develop any of the symptoms described above. If medications (antibiotics)  were prescribed, take them as directed. Document Released: 01/29/2005 Document Revised: 10/05/2011 Document Reviewed: 09/26/2007 [facility] Patient Information 2015 [place], [place]. This information is not intended to replace advice given to you by your health care provider. Make sure you discuss any questions you have with your health care provider.  Abscess An abscess is an infected area that contains a collection of pus and debris.It can occur in almost any part of the body. An abscess is also known as a furuncle or boil. CAUSES  An abscess occurs when tissue gets infected. This can occur from blockage of oil or sweat glands, infection of hair follicles, or a minor injury to the skin. As the body tries to fight the infection, pus collects in the area and creates pressure under the skin. This pressure causes pain. People with weakened immune systems have difficulty fighting infections and get certain abscesses more often.  SYMPTOMS Usually an abscess develops on the skin and becomes a painful mass that is red, warm, and tender. If the abscess forms under the skin, you may feel a moveable soft area under the skin. Some abscesses break open (rupture) on their own, but most will continue to get worse without care. The infection can spread deeper into the body and eventually into the bloodstream, causing you to feel ill.  DIAGNOSIS  Your caregiver will take your medical history and perform a physical exam. A sample of fluid may also be taken from the abscess to determine what is causing  your infection. TREATMENT  Your caregiver may prescribe antibiotic medicines to fight the infection. However, taking antibiotics alone usually does not cure an abscess. Your caregiver may need to make a small cut (incision) in the abscess to drain the pus. In some cases, gauze is packed into the abscess to reduce pain and to continue draining the area. HOME CARE INSTRUCTIONS   Only take over-the-counter or prescription  medicines for pain, discomfort, or fever as directed by your caregiver.  If you were prescribed antibiotics, take them as directed. Finish them even if you start to feel better.  If gauze is used, follow your caregiver's directions for changing the gauze.  To avoid spreading the infection:  Keep your draining abscess covered with a bandage.  Wash your hands well.  Do not share personal care items, towels, or whirlpools with others.  Avoid skin contact with others.  Keep your skin and clothes clean around the abscess.  Keep all follow-up appointments as directed by your caregiver. SEEK MEDICAL CARE IF:   You have increased pain, swelling, redness, fluid drainage, or bleeding.  You have muscle aches, chills, or a general ill feeling.  You have a fever. MAKE SURE YOU:   Understand these instructions.  Will watch your condition.  Will get help right away if you are not doing well or get worse. Document Released: 04/22/2005 Document Revised: 01/12/2012 Document Reviewed: 09/25/2011 [facility] Patient Information 2015 [place], [place]. This information is not intended to replace advice given to you by your health care provider. Make sure you discuss any questions you have with your health care provider.

## 2014-04-01 LAB — WOUND CULTURE: Gram Stain: NONE SEEN

## 2014-06-23 ENCOUNTER — Encounter (HOSPITAL_COMMUNITY): Payer: Self-pay | Admitting: *Deleted

## 2014-06-23 ENCOUNTER — Inpatient Hospital Stay (HOSPITAL_COMMUNITY)
Admission: EM | Admit: 2014-06-23 | Discharge: 2014-06-25 | DRG: 305 | Disposition: A | Discharge status: Home / Self Care | Payer: 59 | Attending: Internal Medicine | Admitting: Internal Medicine

## 2014-06-23 DIAGNOSIS — I10 Essential (primary) hypertension: Principal | ICD-10-CM | POA: Diagnosis present

## 2014-06-23 DIAGNOSIS — Z888 Allergy status to other drugs, medicaments and biological substances status: Secondary | ICD-10-CM

## 2014-06-23 DIAGNOSIS — Z8249 Family history of ischemic heart disease and other diseases of the circulatory system: Secondary | ICD-10-CM

## 2014-06-23 DIAGNOSIS — J309 Allergic rhinitis, unspecified: Secondary | ICD-10-CM

## 2014-06-23 DIAGNOSIS — T50996A Underdosing of other drugs, medicaments and biological substances, initial encounter: Secondary | ICD-10-CM | POA: Diagnosis present

## 2014-06-23 DIAGNOSIS — Z9114 Patient's other noncompliance with medication regimen: Secondary | ICD-10-CM | POA: Diagnosis present

## 2014-06-23 DIAGNOSIS — I161 Hypertensive emergency: Secondary | ICD-10-CM | POA: Diagnosis present

## 2014-06-23 DIAGNOSIS — R000 Tachycardia, unspecified: Secondary | ICD-10-CM | POA: Diagnosis present

## 2014-06-23 DIAGNOSIS — I471 Supraventricular tachycardia: Secondary | ICD-10-CM

## 2014-06-23 DIAGNOSIS — R040 Epistaxis: Secondary | ICD-10-CM | POA: Diagnosis present

## 2014-06-23 DIAGNOSIS — Z87891 Personal history of nicotine dependence: Secondary | ICD-10-CM

## 2014-06-23 LAB — CBC WITH DIFFERENTIAL/PLATELET
BASOS ABS: 0 10*3/uL (ref 0.0–0.1)
BASOS PCT: 0 % (ref 0–1)
Eosinophils Absolute: 0.1 10*3/uL (ref 0.0–0.7)
Eosinophils Relative: 1 % (ref 0–5)
HEMATOCRIT: 44.2 % (ref 36.0–46.0)
Hemoglobin: 14.9 g/dL (ref 12.0–15.0)
Lymphocytes Relative: 37 % (ref 12–46)
Lymphs Abs: 2.9 10*3/uL (ref 0.7–4.0)
MCH: 28.9 pg (ref 26.0–34.0)
MCHC: 33.7 g/dL (ref 30.0–36.0)
MCV: 85.8 fL (ref 78.0–100.0)
MONO ABS: 0.5 10*3/uL (ref 0.1–1.0)
Monocytes Relative: 7 % (ref 3–12)
Neutro Abs: 4.2 10*3/uL (ref 1.7–7.7)
Neutrophils Relative %: 55 % (ref 43–77)
Platelets: 271 10*3/uL (ref 150–400)
RBC: 5.15 MIL/uL — ABNORMAL HIGH (ref 3.87–5.11)
RDW: 14 % (ref 11.5–15.5)
WBC: 7.8 10*3/uL (ref 4.0–10.5)

## 2014-06-23 LAB — COMPREHENSIVE METABOLIC PANEL
ALK PHOS: 135 U/L — AB (ref 39–117)
ALT: 17 U/L (ref 0–35)
ANION GAP: 17 — AB (ref 5–15)
AST: 21 U/L (ref 0–37)
Albumin: 3.9 g/dL (ref 3.5–5.2)
BUN: 10 mg/dL (ref 6–23)
CHLORIDE: 101 meq/L (ref 96–112)
CO2: 22 meq/L (ref 19–32)
Calcium: 9.7 mg/dL (ref 8.4–10.5)
Creatinine, Ser: 0.72 mg/dL (ref 0.50–1.10)
GFR calc Af Amer: >90 mL/min (ref >90)
GFR calc non Af Amer: >90 mL/min (ref >90)
GLUCOSE: 88 mg/dL (ref 70–99)
Potassium: 3.9 mEq/L (ref 3.7–5.3)
Sodium: 140 mEq/L (ref 137–147)
Total Protein: 8.1 g/dL (ref 6.0–8.3)

## 2014-06-23 LAB — PROTIME-INR
INR: 0.92 (ref 0.00–1.49)
Prothrombin Time: 12.5 seconds (ref 11.6–15.2)

## 2014-06-23 LAB — MRSA PCR SCREENING: MRSA BY PCR: NEGATIVE

## 2014-06-23 MED ORDER — TRIAMTERENE-HCTZ 75-50 MG PO TABS
1.0000 | ORAL_TABLET | Freq: Every day | ORAL | Status: DC
  Administered 2014-06-24 – 2014-06-25 (×2): 1 via ORAL
  Filled 2014-06-23 (×2): qty 1

## 2014-06-23 MED ORDER — ACETAMINOPHEN 325 MG PO TABS
650.0000 mg | ORAL_TABLET | Freq: Four times a day (QID) | ORAL | Status: DC | PRN
  Administered 2014-06-23 – 2014-06-24 (×4): 650 mg via ORAL
  Filled 2014-06-23 (×4): qty 2

## 2014-06-23 MED ORDER — ACETAMINOPHEN 650 MG RE SUPP: 650.0000 mg | Freq: Four times a day (QID) | RECTAL | Status: DC | PRN

## 2014-06-23 MED ORDER — LABETALOL HCL 5 MG/ML IV SOLN
10.0000 mg | Freq: Once | INTRAVENOUS | Status: AC
  Administered 2014-06-23: 10 mg via INTRAVENOUS
  Filled 2014-06-23: qty 4

## 2014-06-23 MED ORDER — PHENYLEPHRINE HCL 0.5 % NA SOLN
1.0000 [drp] | Freq: Once | NASAL | Status: AC
  Administered 2014-06-23: 1 [drp] via NASAL
  Filled 2014-06-23: qty 15

## 2014-06-23 MED ORDER — SODIUM CHLORIDE 0.9 % IJ SOLN: 3.0000 mL | INTRAMUSCULAR | Status: DC | PRN

## 2014-06-23 MED ORDER — CARVEDILOL 25 MG PO TABS
25.0000 mg | ORAL_TABLET | Freq: Two times a day (BID) | ORAL | Status: DC
  Administered 2014-06-24 – 2014-06-25 (×3): 25 mg via ORAL
  Filled 2014-06-23 (×5): qty 1

## 2014-06-23 MED ORDER — DEXTROSE 5 % IV SOLN
5.0000 mg/h | INTRAVENOUS | Status: DC
  Administered 2014-06-23: 5 mg/h via INTRAVENOUS
  Administered 2014-06-24 (×2): 15 mg/h via INTRAVENOUS
  Filled 2014-06-23: qty 100

## 2014-06-23 MED ORDER — SODIUM CHLORIDE 0.9 % IJ SOLN
3.0000 mL | Freq: Two times a day (BID) | INTRAMUSCULAR | Status: DC
  Administered 2014-06-24 (×2): 3 mL via INTRAVENOUS

## 2014-06-23 MED ORDER — SODIUM CHLORIDE 0.9 % IV SOLN: 250.0000 mL | INTRAVENOUS | Status: DC | PRN

## 2014-06-23 MED ORDER — HYDRALAZINE HCL 20 MG/ML IJ SOLN
10.0000 mg | Freq: Once | INTRAMUSCULAR | Status: AC
  Administered 2014-06-23: 10 mg via INTRAVENOUS
  Filled 2014-06-23: qty 1

## 2014-06-23 MED ORDER — HYDRALAZINE HCL 20 MG/ML IJ SOLN
10.0000 mg | Freq: Four times a day (QID) | INTRAMUSCULAR | Status: DC | PRN
Start: 2014-06-23 — End: 2014-06-25
  Administered 2014-06-24: 10 mg via INTRAVENOUS
  Filled 2014-06-23 (×2): qty 1

## 2014-06-23 MED ORDER — SALINE SPRAY 0.65 % NA SOLN
1.0000 | NASAL | Status: DC | PRN
  Filled 2014-06-23: qty 44

## 2014-06-23 MED ORDER — LORATADINE 10 MG PO TABS
10.0000 mg | ORAL_TABLET | Freq: Every day | ORAL | Status: DC
  Administered 2014-06-23 – 2014-06-25 (×3): 10 mg via ORAL
  Filled 2014-06-23 (×3): qty 1

## 2014-06-23 MED ORDER — SODIUM CHLORIDE 0.9 % IV BOLUS (SEPSIS)
1000.0000 mL | Freq: Once | INTRAVENOUS | Status: AC
  Administered 2014-06-23: 1000 mL via INTRAVENOUS

## 2014-06-23 MED ORDER — ONDANSETRON HCL 4 MG/2ML IJ SOLN
4.0000 mg | Freq: Four times a day (QID) | INTRAMUSCULAR | Status: DC | PRN
  Administered 2014-06-23 – 2014-06-24 (×2): 4 mg via INTRAVENOUS
  Filled 2014-06-23 (×2): qty 2

## 2014-06-23 MED ORDER — DILTIAZEM LOAD VIA INFUSION
10.0000 mg | Freq: Once | INTRAVENOUS | Status: AC
  Administered 2014-06-23: 10 mg via INTRAVENOUS
  Filled 2014-06-23: qty 10

## 2014-06-23 MED ORDER — ONDANSETRON HCL 4 MG PO TABS: 4.0000 mg | ORAL_TABLET | Freq: Four times a day (QID) | ORAL | Status: DC | PRN

## 2014-06-23 MED ORDER — LABETALOL HCL 5 MG/ML IV SOLN
20.0000 mg | Freq: Once | INTRAVENOUS | Status: AC
  Administered 2014-06-23: 20 mg via INTRAVENOUS
  Filled 2014-06-23: qty 4

## 2014-06-23 NOTE — ED Notes (Addendum)
Pt nose begin bleeding again; pt is stable; pinching top of nose with head down; Ed MD notified;afrin soaked packing placed in right nostril which appears to have stopped bleeding for now; Dr.Merril notified; still awaiting step down bed placement at this time

## 2014-06-23 NOTE — Plan of Care (Signed)
Problem: Consults Goal: General Medical Patient Education See Patient Education Module for specific education. Outcome: Completed/Met Date Met:  06/23/14 Goal: Skin Care Protocol Initiated - if Braden Score 18 or less If consults are not indicated, leave blank or document N/A Outcome: Completed/Met Date Met:  06/23/14 Goal: Nutrition Consult-if indicated Outcome: Completed/Met Date Met:  06/23/14 Goal: Diabetes Guidelines if Diabetic/Glucose > 140 If diabetic or lab glucose is > 140 mg/dl - Initiate Diabetes/Hyperglycemia Guidelines & Document Interventions  Outcome: Not Applicable Date Met:  24/49/75  Problem: Phase I Progression Outcomes Goal: Pain controlled with appropriate interventions Outcome: Completed/Met Date Met:  06/23/14 Goal: OOB as tolerated unless otherwise ordered Outcome: Completed/Met Date Met:  06/23/14

## 2014-06-23 NOTE — ED Provider Notes (Signed)
CSN: [id number]     Arrival date & time 06/23/14  1607 History   First MD Initiated Contact with Patient 06/23/14 1617     Chief Complaint  Patient presents with  . Epistaxis     (Consider location/radiation/quality/duration/timing/severity/associated sxs/prior Treatment) Patient is a 53 y.o. female presenting with nosebleeds.  Epistaxis Location:  Bilateral Severity:  Severe Duration:  30 minutes Timing:  Constant Progression:  Unchanged Chronicity:  New Context: hypertension   Context: not anticoagulants and not bleeding disorder   Relieved by:  Nothing Worsened by:  Nothing tried Ineffective treatments:  None tried Associated symptoms: blood in oropharynx   Associated symptoms: no dizziness, no facial pain, no fever and no sore throat     Past Medical History  Diagnosis Date  . Allergy   . Anemia   . Hypertension     [facility]  . [id number](478.2Headache(784.0)     Pit. hemmorrhage 2008   Past Surgical History  Procedure Laterality Date  . Abdominal hysterectomy     Family History  Problem Relation Age of Onset  . Hypertension Other   . Cancer Mother    History  Substance Use Topics  . Smoking status: Former Smoker -- 1.00 packs/day for 25 years    Types: Cigarettes    Quit date: 01/16/2014  . Smokeless tobacco: Never Used  . Alcohol Use: 1.2 oz/week    2 Cans of beer per week   OB History    No data available     Review of Systems  Constitutional: Negative for fever.  HENT: Positive for nosebleeds. Negative for sore throat.   Neurological: Negative for dizziness.  All other systems reviewed and are negative.     Allergies  Clonidine derivatives and Amlodipine besylate  Home Medications   Prior to Admission medications   Medication Sig Start Date End Date Taking? Authorizing Provider  carvedilol (COREG) 25 MG tablet Take 1 tablet (25 mg total) by mouth 2 (two) times daily with a meal. 12/14/13 12/14/14 Yes Elvina SidleKurt Lauenstein, MD   triamterene-hydrochlorothiazide (MAXZIDE) 75-50 MG per tablet Take 1 tablet by mouth daily. 02/09/14  Yes Aleksei Plotnikov V, MD   BP 198/96 mmHg  Pulse 70  Resp 24  SpO2 99% Physical Exam  Constitutional: She is oriented to person, place, and time. She appears well-developed and well-nourished.  HENT:  Head: Normocephalic and atraumatic.  Right Ear: External ear normal.  Left Ear: External ear normal.  Nose: Epistaxis (bil, brisk) is observed.  Eyes: Conjunctivae and EOM are normal. Pupils are equal, round, and reactive to light.  Neck: Normal range of motion. Neck supple.  Cardiovascular: Normal rate, regular rhythm, normal heart sounds and intact distal pulses.   Pulmonary/Chest: Effort normal and breath sounds normal.  Abdominal: Soft. Bowel sounds are normal. There is no tenderness.  Musculoskeletal: Normal range of motion.  Neurological: She is alert and oriented to person, place, and time.  Skin: Skin is warm and dry.  Vitals reviewed.   ED Course  EPISTAXIS MANAGEMENT Date/Time: 06/23/2014 6:54 PM Performed by: Mirian MoGENTRY, Darris Staiger Authorized by: Mirian MoGENTRY, Lizzie Cokley Consent: Verbal consent obtained. Patient sedated: no Treatment site: right anterior (deep) Repair method: merocel sponge (neosynephrine) Post-procedure assessment: bleeding stopped Treatment complexity: complex Patient tolerance: Patient tolerated the procedure well with no immediate complications   (including critical care time) Labs Review Labs Reviewed  COMPREHENSIVE METABOLIC PANEL - Abnormal; Notable for the following:    Alkaline Phosphatase 135 (*)    Total Bilirubin <0.2 (*)  Anion gap 17 (*)    All other components within normal limits  CBC WITH DIFFERENTIAL - Abnormal; Notable for the following:    RBC 5.15 (*)    All other components within normal limits  PROTIME-INR    Imaging Review No results found.   EKG Interpretation   Date/Time:  Saturday June 23 2014 16:22:24  EST Ventricular Rate:  152 PR Interval:  125 QRS Duration: 70 QT Interval:  298 QTC Calculation: 474 R Axis:   74 Text Interpretation:  Age not entered, assumed to be  53 years old for  purpose of ECG interpretation Sinus tachycardia Probable left atrial  enlargement Anterior infarct, old Abnormal T, consider ischemia, diffuse  leads Baseline wander in lead(s) II III aVR aVF V1 V3 V6 No old tracing to  compare Confirmed by Mirian MoGentry, Sheana Bir [phone]([id number]) on 06/23/2014 4:32:26 PM     CRITICAL CARE Performed by: Mirian MoGentry, Shayna Eblen   Total critical care time: 45 min  Critical care time was exclusive of separately billable procedures and treating other patients.  Critical care was necessary to treat or prevent imminent or life-threatening deterioration.  Critical care was time spent personally by me on the following activities: development of treatment plan with patient and/or surrogate as well as nursing, discussions with consultants, evaluation of patient's response to treatment, examination of patient, obtaining history from patient or surrogate, ordering and performing treatments and interventions, ordering and review of laboratory studies, ordering and review of radiographic studies, pulse oximetry and re-evaluation of patient's condition.  MDM   Final diagnoses:  None    53 y.o. female with pertinent PMH of HTN presents with bil epistaxis and HTN.  Pt tachycardic to 170 in triage, however this improved to 140 in the room, then later to normal without intervention.  Initial BP to 200/150.  Pt has no ho SVT and is visibly anxious.  This epistaxis had been going on for approximately 30 minutes.    Labs returned as above.  Pt was given a total of 30mg  labetalol and still with significant HTN.  Her nosebleed resolved with neosynephrine, but given that it was bil, concern for posterior bleed is heightened.  Consulted medicine for admission.  Prior to admission, bleeding began again, this time  solidly from R nare.  Packing placed at this site with resolution of bleeding.    Epistaxis     Mirian MoMatthew Avalynne Diver, MD 06/24/14 [phone]

## 2014-06-23 NOTE — ED Notes (Signed)
Pt in c/o severe nose bleed that started about 20 min ago, large clots noted, pt also coughing up large clots, pt tachycardic in triage, denies pain

## 2014-06-23 NOTE — H&P (Addendum)
[facility] History and Physical  [place] BingGloria A Barsch WUJ:[id number] DOB: 07-Mar-1961 DOA: 06/23/2014  Referring physician: Dr Littie DeedsGentry - [facility] PCP: Sanda Lingerhomas Jones, MD   Chief Complaint: nose bleed  HPI: Cynthia Hendrix is a 53 y.o. female  Nose bleed. Started around 1600 topday. Bilateral nares. Tried holding a towel to nose w/ pressure w/o benefit. Not taking any blood thinners. Last took BP medications 2 days ago.   Review of Systems:  Constitutional:  No weight loss, night sweats, Fevers, chills, fatigue.  HEENT:  Recent mild nasal congestion Cardio-vascular:  No chest pain, Orthopnea, PND, swelling in lower extremities, anasarca, dizziness, palpitations  GI:  No heartburn, indigestion, abdominal pain, nausea, vomiting, diarrhea, change in bowel habits, loss of appetite  Resp:  No shortness of breath with exertion or at rest. No excess mucus, no productive cough, No non-productive cough, No coughing up of blood.No change in color of mucus.No wheezing.No chest wall deformity  Skin:  no rash or lesions.  GU:  no dysuria, change in color of urine, no urgency or frequency. No flank pain.  Musculoskeletal:  No joint pain or swelling. No decreased range of motion. No back pain.  Psych:  No change in mood or affect. No depression or anxiety. No memory loss.   Past Medical History  Diagnosis Date  . Allergy   . Anemia   . Hypertension     [facility]  . [id number](657.8Headache(784.0)     Pit. hemmorrhage 2008   Past Surgical History  Procedure Laterality Date  . Abdominal hysterectomy     Social History:  reports that she quit smoking about 5 months ago. Her smoking use included Cigarettes. She has a 25 pack-year smoking history. She has never used smokeless tobacco. She reports that she drinks about 1.2 oz of alcohol per week. She reports that she does not use illicit drugs.  Allergies  Allergen Reactions  . Clonidine Derivatives     drowsiness  . Amlodipine Besylate     REACTION: constipation    Family History  Problem Relation Age of Onset  . Hypertension Other   . Cancer Mother      Prior to Admission medications   Medication Sig Start Date End Date Taking? Authorizing Provider  carvedilol (COREG) 25 MG tablet Take 1 tablet (25 mg total) by mouth 2 (two) times daily with a meal. 12/14/13 12/14/14 Yes Elvina SidleKurt Lauenstein, MD  triamterene-hydrochlorothiazide (MAXZIDE) 75-50 MG per tablet Take 1 tablet by mouth daily. 02/09/14  Yes Tresa GarterAleksei Plotnikov V, MD   Physical Exam: Filed Vitals:   06/23/14 1815 06/23/14 1830 06/23/14 1900 06/23/14 1930  BP: 198/96 208/111 216/103 203/114  Pulse: 70 65 63 68  Resp: 24 17 13 16   SpO2: 99% 99% 100% 100%    Wt Readings from Last 3 Encounters:  03/29/14 79.107 kg (174 lb 6.4 oz)  02/09/14 78.019 kg (172 lb)  01/15/14 77.565 kg (171 lb)    General:  Appears calm and comfortable Eyes:  PERRL, normal lids, irises & conjunctiva ENT: mild intermittent epistaxis of R nare. Under speculum exam no active bleeding vessel noted.  Neck:  no LAD, masses or thyromegaly Cardiovascular:  RRR, no m/r/g. Trace bilat LE edema. Telemetry:  SR, no arrhythmias  Respiratory:  CTA bilaterally, no w/r/r. Normal respiratory effort. Abdomen:  soft, ntnd Skin:  no rash or induration seen on limited exam Musculoskeletal:  grossly normal tone BUE/BLE Psychiatric:  grossly normal mood and affect, speech fluent and appropriate Neurologic:  grossly non-focal.          Labs on Admission:  Basic Metabolic Panel:  Recent Labs Lab 06/23/14 1630  NA 140  K 3.9  CL 101  CO2 22  GLUCOSE 88  BUN 10  CREATININE 0.72  CALCIUM 9.7   Liver Function Tests:  Recent Labs Lab 06/23/14 1630  AST 21  ALT 17  ALKPHOS 135*  BILITOT <0.2*  PROT 8.1  ALBUMIN 3.9   No results for input(s): LIPASE, AMYLASE in the last 168 hours. No results for input(s): AMMONIA in the last 168 hours. CBC:  Recent Labs Lab 06/23/14 1630  WBC 7.8    NEUTROABS 4.2  HGB 14.9  HCT 44.2  MCV 85.8  PLT 271   Cardiac Enzymes: No results for input(s): CKTOTAL, CKMB, CKMBINDEX, TROPONINI in the last 168 hours.  BNP (last 3 results) No results for input(s): PROBNP in the last 8760 hours. CBG: No results for input(s): GLUCAP in the last 168 hours.  Radiological Exams on Admission: No results found.  EKG: Independently reviewed. Sinus Tach  Assessment/Plan Principal Problem:   Hypertensive emergency Active Problems:   Allergic rhinitis   Epistaxis   Sinus tachycardia  Hypertensive emergency: secondary to pt not taking medications. Pt w/ multiple notes in chart eluding to the fact that this is a chronic problem for this patient. Labetolol 30mg  given in ED w/ some improvement.  - Obs admission - pr requiring step down admission due to floor protocol.  - Hydralazine 10mg  x1 then PRN SBP >190  - Restart home coreg and Maxzide.   ----------------------  Addendum 21:30 Pt calling to ask for ENT consult - reasurred that BP control is necessary first Pt bp remains elevated to 230/120 despite 10mh Hydralazine 1.5 hrs ago Clonidine not an option as allergic. Start Dilt drip and administer home Maxzide and coreg.   Shelly Flattenavid Merrell, MD Family Medicine 06/23/2014, 9:37 PM     Epistaxis: Hgb nml. Likely from posterior R nare from extreme hypertension. No anticoagulants on board. Likely made worse from allergic irritation as of late. Afrin, gauze and pressure w/ significant improvement in ED - Monitor - Afrin and gauze as needed - ENT in am if continues to bleed for likely cautery.   Sinus tach: likely from anxiety, missed home bblocker, and extreme HTN. Improved w/ fluids and epistaxis resolution - monitor  Allergic rhinitis:  - zyrtec - nasal saline once epistaxis controlled   Code Status: FULL DVT Prophylaxis: SCD and frequent amublation. No anticoagulation due to profound epistaxis.  Family Communication:  Husband Disposition Plan: Obs  Time spent: 50 min  MERRELL, DAVID J Family Medicine [facility] [email or web address] Password [email or web address]

## 2014-06-23 NOTE — Progress Notes (Signed)
Pt became nauseous and vomited coffee ground emesis. Pt given prn medication for nausea. Pt checked 15 minutes later and already stated relief from nausea. Will continue to monitor at this time.

## 2014-06-24 DIAGNOSIS — R000 Tachycardia, unspecified: Secondary | ICD-10-CM | POA: Diagnosis present

## 2014-06-24 DIAGNOSIS — J309 Allergic rhinitis, unspecified: Secondary | ICD-10-CM | POA: Diagnosis present

## 2014-06-24 DIAGNOSIS — Z888 Allergy status to other drugs, medicaments and biological substances status: Secondary | ICD-10-CM | POA: Diagnosis not present

## 2014-06-24 DIAGNOSIS — I10 Essential (primary) hypertension: Secondary | ICD-10-CM | POA: Diagnosis present

## 2014-06-24 DIAGNOSIS — Z9114 Patient's other noncompliance with medication regimen: Secondary | ICD-10-CM | POA: Diagnosis present

## 2014-06-24 DIAGNOSIS — Z87891 Personal history of nicotine dependence: Secondary | ICD-10-CM | POA: Diagnosis not present

## 2014-06-24 DIAGNOSIS — Z8249 Family history of ischemic heart disease and other diseases of the circulatory system: Secondary | ICD-10-CM | POA: Diagnosis not present

## 2014-06-24 DIAGNOSIS — T50996A Underdosing of other drugs, medicaments and biological substances, initial encounter: Secondary | ICD-10-CM | POA: Diagnosis present

## 2014-06-24 DIAGNOSIS — R040 Epistaxis: Secondary | ICD-10-CM | POA: Diagnosis present

## 2014-06-24 LAB — CBC
HEMATOCRIT: 41.8 % (ref 36.0–46.0)
Hemoglobin: 13.6 g/dL (ref 12.0–15.0)
MCH: 28.2 pg (ref 26.0–34.0)
MCHC: 32.5 g/dL (ref 30.0–36.0)
MCV: 86.7 fL (ref 78.0–100.0)
Platelets: 256 10*3/uL (ref 150–400)
RBC: 4.82 MIL/uL (ref 3.87–5.11)
RDW: 14.2 % (ref 11.5–15.5)
WBC: 7.8 10*3/uL (ref 4.0–10.5)

## 2014-06-24 LAB — COMPREHENSIVE METABOLIC PANEL
ALT: 14 U/L (ref 0–35)
ANION GAP: 14 (ref 5–15)
AST: 14 U/L (ref 0–37)
Albumin: 3.7 g/dL (ref 3.5–5.2)
Alkaline Phosphatase: 119 U/L — ABNORMAL HIGH (ref 39–117)
BILIRUBIN TOTAL: 0.3 mg/dL (ref 0.3–1.2)
BUN: 9 mg/dL (ref 6–23)
CHLORIDE: 103 meq/L (ref 96–112)
CO2: 24 meq/L (ref 19–32)
CREATININE: 0.62 mg/dL (ref 0.50–1.10)
Calcium: 8.6 mg/dL (ref 8.4–10.5)
GFR calc Af Amer: >90 mL/min (ref >90)
GLUCOSE: 113 mg/dL — AB (ref 70–99)
Potassium: 3.8 mEq/L (ref 3.7–5.3)
Sodium: 141 mEq/L (ref 137–147)
Total Protein: 7.4 g/dL (ref 6.0–8.3)

## 2014-06-24 MED ORDER — HYDROCODONE-ACETAMINOPHEN 5-325 MG PO TABS
2.0000 | ORAL_TABLET | Freq: Once | ORAL | Status: AC
  Administered 2014-06-24: 2 via ORAL
  Filled 2014-06-24: qty 2

## 2014-06-24 MED ORDER — HYDRALAZINE HCL 20 MG/ML IJ SOLN
10.0000 mg | Freq: Once | INTRAMUSCULAR | Status: AC
  Administered 2014-06-24: 10 mg via INTRAVENOUS
  Filled 2014-06-24: qty 1

## 2014-06-24 MED ORDER — LISINOPRIL 20 MG PO TABS
20.0000 mg | ORAL_TABLET | Freq: Every day | ORAL | Status: DC
  Administered 2014-06-24: 20 mg via ORAL
  Filled 2014-06-24 (×2): qty 1

## 2014-06-24 NOTE — Progress Notes (Signed)
[facility] PROGRESS NOTE  [name] ZOX:[id number] DOB: [birth year] DOA: 06/23/2014 PCP: Cynthia Lingerhomas Jones, MD  Assessment/Plan: 1. Hypertensive urgency -Patient reporting been out of her medications for the past week. She had been on carvedilol 25 mg by mouth twice a day and maxzide 75/50 1 tablet by mouth daily -Unfortunately ran out of her medications about a week ago, presenting with hypertensive urgency. -She is on a Cardizem drip. Will add lisinopril 20 mg by mouth every daily starting this morning, continue Coreg 25 mg by mouth twice a day along with Maxzide, titrate off IV Cardizem.  2.  Epistaxes -Likely secondary to hypertensive urgency -Bleeding now controlled, right nare packed   Code Status: Full code Family Communication: Spoke to family members present at bedside Disposition Plan: Transitioning to oral antihypertensive agents, anticipate discharge in the next 24 hours if remains stable    HPI/Subjective: Patient is a pleasant 53 year old female with a past medical history of hypertension, who ran out of her medications approximate 1 week ago presented to the emergency room on 06/23/2014 with complaints of epistaxis. She was found to have elevated blood pressures. Overnight she was started on a Cardizem drip and admitted to the stepdown unit. She continued to have epistaxis 6 for which her nose was packed. This morning patient continues to have elevated blood pressures with her last blood pressure of 180/94.  Objective: Filed Vitals:   06/24/14 0846  BP: 187/102  Pulse:   Temp:   Resp:     Intake/Output Summary (Last 24 hours) at 06/24/14 0903 Last data filed at 06/24/14 0800  Gross per 24 hour  Intake 1165.25 ml  Output    300 ml  Net 865.25 ml   Filed Weights   06/23/14 2230  Weight: 83.144 kg (183 lb 4.8 oz)    Exam:   General:  No acute distress, awake alert oriented  Cardiovascular: Tachycardic, regular rate rhythm normal  S1S2  Respiratory: Normal respiratory effort, I do not auscultate crackles or rales  Abdomen: Soft nontender nondistended positive bowel sounds  Musculoskeletal: No edema   Data Reviewed: Basic Metabolic Panel:  Recent Labs Lab 06/23/14 1630 06/24/14 0229  NA 140 141  K 3.9 3.8  CL 101 103  CO2 22 24  GLUCOSE 88 113*  BUN 10 9  CREATININE 0.72 0.62  CALCIUM 9.7 8.6   Liver Function Tests:  Recent Labs Lab 06/23/14 1630 06/24/14 0229  AST 21 14  ALT 17 14  ALKPHOS 135* 119*  BILITOT <0.2* 0.3  PROT 8.1 7.4  ALBUMIN 3.9 3.7   No results for input(s): LIPASE, AMYLASE in the last 168 hours. No results for input(s): AMMONIA in the last 168 hours. CBC:  Recent Labs Lab 06/23/14 1630 06/24/14 0229  WBC 7.8 7.8  NEUTROABS 4.2  --   HGB 14.9 13.6  HCT 44.2 41.8  MCV 85.8 86.7  PLT 271 256   Cardiac Enzymes: No results for input(s): CKTOTAL, CKMB, CKMBINDEX, TROPONINI in the last 168 hours. BNP (last 3 results) No results for input(s): PROBNP in the last 8760 hours. CBG: No results for input(s): GLUCAP in the last 168 hours.  Recent Results (from the past 240 hour(s))  MRSA PCR Screening     Status: None   Collection Time: 06/23/14  9:06 PM  Result Value Ref Range Status   MRSA by PCR NEGATIVE NEGATIVE Final    Comment:        The GeneXpert MRSA Assay (FDA approved for NASAL specimens  only), is one component of a comprehensive MRSA colonization surveillance program. It is not intended to diagnose MRSA infection nor to guide or monitor treatment for MRSA infections.      Studies: No results found.  Scheduled Meds: . carvedilol  25 mg Oral BID WC  . lisinopril  20 mg Oral Daily  . loratadine  10 mg Oral Daily  . sodium chloride  3 mL Intravenous Q12H  . triamterene-hydrochlorothiazide  1 tablet Oral Daily   Continuous Infusions: . diltiazem (CARDIZEM) infusion 15 mg/hr (06/24/14 0840)    Principal Problem:   Hypertensive  emergency Active Problems:   Allergic rhinitis   Epistaxis   Sinus tachycardia    Time spent: 25 min    Cynthia Hendrix  [facility] Pager [phone]. If 7PM-7AM, please contact night-coverage at [email or web address], password [email or web address] 06/24/2014, 9:03 AM  LOS: 1 day

## 2014-06-24 NOTE — Progress Notes (Signed)
Utilization Review Completed.   Zaryah Seckel, RN, BSN Nurse Case Manager  

## 2014-06-25 DIAGNOSIS — J3089 Other allergic rhinitis: Secondary | ICD-10-CM

## 2014-06-25 MED ORDER — LISINOPRIL 40 MG PO TABS
40.0000 mg | ORAL_TABLET | Freq: Every day | ORAL | Status: DC
  Administered 2014-06-25: 40 mg via ORAL
  Filled 2014-06-25 (×2): qty 1

## 2014-06-25 MED ORDER — LISINOPRIL 20 MG PO TABS: 40.0000 mg | ORAL_TABLET | Freq: Every day | ORAL | Status: DC

## 2014-06-25 MED ORDER — LORATADINE 10 MG PO TABS: 10.0000 mg | ORAL_TABLET | Freq: Every day | ORAL | Status: DC

## 2014-06-25 NOTE — Plan of Care (Signed)
Problem: Discharge Progression Outcomes Goal: Complications resolved/controlled Outcome: Not Applicable Date Met:  06/25/14     

## 2014-06-25 NOTE — Plan of Care (Signed)
Problem: Discharge Progression Outcomes Goal: Pain controlled with appropriate interventions Outcome: Completed/Met Date Met:  06/25/14

## 2014-06-25 NOTE — Plan of Care (Signed)
Problem: Discharge Progression Outcomes Goal: Tolerating diet Outcome: Completed/Met Date Met:  06/25/14     

## 2014-06-25 NOTE — Plan of Care (Signed)
Problem: Discharge Progression Outcomes Goal: Activity appropriate for discharge plan Outcome: Completed/Met Date Met:  06/25/14     

## 2014-06-25 NOTE — Plan of Care (Signed)
Problem: Discharge Progression Outcomes Goal: Hemodynamically stable Outcome: Completed/Met Date Met:  06/25/14

## 2014-06-25 NOTE — Discharge Summary (Signed)
Physician Discharge Summary  [place] BingGloria A Gearing ZOX:[id number] DOB: [birth year] DOA: 06/23/2014  PCP: Sanda Lingerhomas Jones, MD  Admit date: 06/23/2014 Discharge date: 06/25/2014  Time spent: 35 minutes  Recommendations for Outpatient Follow-up:  1. Please follow-up on her blood pressures, she presented with hypertensive urgency. Lisinopril 40 mg by mouth daily was added to her regimen. 2. Patient will need anterior packing removed, ENT appointment set up on discharge  Discharge Diagnoses:  Principal Problem:   Hypertensive emergency Active Problems:   Allergic rhinitis   Epistaxis   Sinus tachycardia   Discharge Condition: Stable/improved   Diet recommendation: Heart healthy diet  Filed Weights   06/23/14 2230  Weight: 83.144 kg (183 lb 4.8 oz)    History of present illness:  Cynthia Hendrix is a 53 y.o. female  Nose bleed. Started around 1600 topday. Bilateral nares. Tried holding a towel to nose w/ pressure w/o benefit. Not taking any blood thinners. Last took BP medications 2 days ago.  Hospital Course:  Patient is a pleasant 53 year old female with a past medical history of hypertension, [facility]/2014 with complaints of epistaxis. She had not been on anticoagulation, found to be hypertensive with blood pressure of 216/108. Patient reporting not taking antihypertensive agents in the past week. She was admitted to the stepdown unit and initially started on a Cardizem drip with a continuation of Maxzide and Coreg. Lisinopril was added to her regimen. Gradually she was titrated off of IV Cardizem with improvement to her blood pressures. With regard to Epistaxis she underwent packing to right Darene Lamerair in the emergency room. This likely resulted from hypertensive crisis. I discussed case with Dr. Pollyann Kennedyosen of ENT over telephone conversation who recommended that the packing remained in place for 5 days and that he would see her in the office. She did  not have further episodes of epistaxis during this hospitalization. By 06/25/2014 she was tolerating by mouth, with overall improvement to blood pressures, discharged to her home in stable condition. Will need close follow-up of blood pressures.   Consultations:  Telephone consult made to Dr. Pollyann Kennedyosen of ENT  Discharge Exam: Filed Vitals:   06/25/14 0740  BP: 176/107  Pulse:   Temp:   Resp: 15    General: Patient is awake, alert, no acute distress Cardiovascular: Regular rate and rhythm normal S1-S2 no murmurs rubs or gallops Respiratory: Normal respiratory effort, lungs are clear to auscultation bilaterally Abdomen: Soft nontender nondistended Extremities: No cyanosis clubbing or edema  Discharge Instructions You were cared for by a hospitalist during your hospital stay. If you have any questions about your discharge medications or the care you received while you were in the hospital after you are discharged, you can call the unit and asked to speak with the hospitalist on call if the hospitalist that took care of you is not available. Once you are discharged, your primary care physician will handle any further medical issues. Please note that NO REFILLS for any discharge medications will be authorized once you are discharged, as it is imperative that you return to your primary care physician (or establish a relationship with a primary care physician if you do not have one) for your aftercare needs so that they can reassess your need for medications and monitor your lab values.  Discharge Instructions    Call MD for:  difficulty breathing, headache or visual disturbances    Complete by:  As directed      Call  MD for:  extreme fatigue    Complete by:  As directed      Call MD for:  hives    Complete by:  As directed      Call MD for:  persistant dizziness or light-headedness    Complete by:  As directed      Call MD for:  persistant nausea and vomiting    Complete by:  As directed       Call MD for:  redness, tenderness, or signs of infection (pain, swelling, redness, odor or green/yellow discharge around incision site)    Complete by:  As directed      Call MD for:  severe uncontrolled pain    Complete by:  As directed      Call MD for:  temperature >100.4    Complete by:  As directed      Diet - low sodium heart healthy    Complete by:  As directed      Increase activity slowly    Complete by:  As directed           Current Discharge Medication List    START taking these medications   Details  lisinopril (PRINIVIL,ZESTRIL) 20 MG tablet Take 2 tablets (40 mg total) by mouth daily. Qty: 30 tablet, Refills: 6    loratadine (CLARITIN) 10 MG tablet Take 1 tablet (10 mg total) by mouth daily. Qty: 30 tablet, Refills: 1      CONTINUE these medications which have NOT CHANGED   Details  carvedilol (COREG) 25 MG tablet Take 1 tablet (25 mg total) by mouth 2 (two) times daily with a meal. Qty: 180 tablet, Refills: 3   Associated Diagnoses: Accelerated hypertension    triamterene-hydrochlorothiazide (MAXZIDE) 75-50 MG per tablet Take 1 tablet by mouth daily. Qty: 90 tablet, Refills: 3       Allergies  Allergen Reactions  . Clonidine Derivatives     drowsiness  . Amlodipine Besylate     REACTION: constipation      The results of significant diagnostics from this hospitalization (including imaging, microbiology, ancillary and laboratory) are listed below for reference.    Significant Diagnostic Studies: No results found.  Microbiology: Recent Results (from the past 240 hour(s))  MRSA PCR Screening     Status: None   Collection Time: 06/23/14  9:06 PM  Result Value Ref Range Status   MRSA by PCR NEGATIVE NEGATIVE Final    Comment:        The GeneXpert MRSA Assay (FDA approved for NASAL specimens only), is one component of a comprehensive MRSA colonization surveillance program. It is not intended to diagnose MRSA infection nor to guide or monitor  treatment for MRSA infections.      Labs: Basic Metabolic Panel:  Recent Labs Lab 06/23/14 1630 06/24/14 0229  NA 140 141  K 3.9 3.8  CL 101 103  CO2 22 24  GLUCOSE 88 113*  BUN 10 9  CREATININE 0.72 0.62  CALCIUM 9.7 8.6   Liver Function Tests:  Recent Labs Lab 06/23/14 1630 06/24/14 0229  AST 21 14  ALT 17 14  ALKPHOS 135* 119*  BILITOT <0.2* 0.3  PROT 8.1 7.4  ALBUMIN 3.9 3.7   No results for input(s): LIPASE, AMYLASE in the last 168 hours. No results for input(s): AMMONIA in the last 168 hours. CBC:  Recent Labs Lab 06/23/14 1630 06/24/14 0229  WBC 7.8 7.8  NEUTROABS 4.2  --   HGB 14.9 13.6  HCT  44.2 41.8  MCV 85.8 86.7  PLT 271 256   Cardiac Enzymes: No results for input(s): CKTOTAL, CKMB, CKMBINDEX, TROPONINI in the last 168 hours. BNP: BNP (last 3 results) No results for input(s): PROBNP in the last 8760 hours. CBG: No results for input(s): GLUCAP in the last 168 hours.     SignedJeralyn Bennett  [facility] 06/25/2014, 8:20 AM

## 2014-06-25 NOTE — Progress Notes (Signed)
Discharged home by wheelchair accompanied by husband, right nare pack intact, discharged instructions, return to work note, and prescription given to pt. Belongings taken home.

## 2014-06-25 NOTE — Plan of Care (Signed)
Problem: Discharge Progression Outcomes Goal: Discharge plan in place and appropriate Outcome: Completed/Met Date Met:  06/25/14

## 2014-07-05 ENCOUNTER — Encounter: Payer: Self-pay | Admitting: Internal Medicine

## 2014-07-05 ENCOUNTER — Ambulatory Visit (INDEPENDENT_AMBULATORY_CARE_PROVIDER_SITE_OTHER): Payer: 59 | Admitting: Internal Medicine

## 2014-07-05 VITALS — BP 150/102 | HR 66 | Temp 98.2°F | Ht 69.0 in | Wt 178.0 lb

## 2014-07-05 DIAGNOSIS — I10 Essential (primary) hypertension: Secondary | ICD-10-CM

## 2014-07-05 DIAGNOSIS — F418 Other specified anxiety disorders: Secondary | ICD-10-CM

## 2014-07-05 DIAGNOSIS — R609 Edema, unspecified: Secondary | ICD-10-CM

## 2014-07-05 DIAGNOSIS — J3089 Other allergic rhinitis: Secondary | ICD-10-CM

## 2014-07-05 MED ORDER — IRBESARTAN-HYDROCHLOROTHIAZIDE 300-12.5 MG PO TABS: 1.0000 | ORAL_TABLET | Freq: Every day | ORAL | Status: DC

## 2014-07-05 MED ORDER — LABETALOL HCL 300 MG PO TABS
300.0000 mg | ORAL_TABLET | Freq: Two times a day (BID) | ORAL | Status: DC
Start: 2014-07-05 — End: 2015-07-26

## 2014-07-05 NOTE — Progress Notes (Signed)
   Subjective:    Patient ID: [place] [name], female    DOB: 15-Jan-1961, 53 y.o.   MRN: [id number]  HPI  Here to f/u; overall doing ok,  Pt denies chest pain, increased sob or doe, wheezing, orthopnea, PND, increased LE swelling, palpitations, or syncope, but with recurring dizziness/weakness seems to happen 2 hrs after taking the maxide, so does not take daily.  Pt denies polydipsia, polyuria, or low sugar symptoms such as weakness or confusion improved with po intake.  Pt denies new neurological symptoms such as new headache, or facial or extremity weakness or numbness.   Pt states overall good compliance with meds, has been trying to follow lower cholesterol diet, with wt overall stable,  but little exercise however.  Tearful today, here with husband, frustrated over lack of good BP control for many years.  BP recently similar to today despite good med compliance. Denies worsening depressive symptoms, suicidal ideation, or panic  Past Medical History  Diagnosis Date  . Allergy   . Anemia   . Hypertension     followed by [facility]  . MVHQIONG(295.2Headache(784.0)     Pit. hemmorrhage 2008   Past Surgical History  Procedure Laterality Date  . Abdominal hysterectomy      reports that she quit smoking about 5 months ago. Her smoking use included Cigarettes. She has a 25 pack-year smoking history. She has never used smokeless tobacco. She reports that she drinks about 1.2 oz of alcohol per week. She reports that she does not use illicit drugs. family history includes Cancer in her mother; Hypertension in her other. Allergies  Allergen Reactions  . Clonidine Derivatives     drowsiness  . Amlodipine Besylate     REACTION: constipation   Current Outpatient Prescriptions on File Prior to Visit  Medication Sig Dispense Refill  . loratadine (CLARITIN) 10 MG tablet Take 1 tablet (10 mg total) by mouth daily. (Patient not taking: Reported on 07/05/2014) 30 tablet 1   No current facility-administered  medications on file prior to visit.   Review of Systems  Constitutional: Negative for unusual diaphoresis or other sweats  HENT: Negative for ringing in ear Eyes: Negative for double vision or worsening visual disturbance.  Respiratory: Negative for choking and stridor.   Gastrointestinal: Negative for vomiting or other signifcant bowel change Genitourinary: Negative for hematuria or decreased urine volume.  Musculoskeletal: Negative for other MSK pain or swelling Skin: Negative for color change and worsening wound.  Neurological: Negative for tremors and numbness other than noted  Psychiatric/Behavioral: Negative for decreased concentration or agitation other than above      Objective:   Physical Exam BP 150/102 mmHg  Pulse 66  Temp(Src) 98.2 F (36.8 C) (Oral)  Ht 5\' 9"  (1.753 m)  Wt 178 lb (80.74 kg)  BMI 26.27 kg/m2  SpO2 99% VS noted, not ill appearing Constitutional: Pt appears well-developed, well-nourished.  HENT: Head: NCAT.  Right Ear: External ear normal.  Left Ear: External ear normal.  Eyes: . Pupils are equal, round, and reactive to light. Conjunctivae and EOM are normal Neck: Normal range of motion. Neck supple.  Cardiovascular: Normal rate and regular rhythm.   Pulmonary/Chest: Effort normal and breath sounds normal.  Abd:  Soft, NT, ND, + BS, no bruit Neurological: Pt is alert. Not confused , motor grossly intact Skin: Skin is warm. No rash, no LE edema Psychiatric: Pt behavior is normal. No agitation.      Assessment & Plan:

## 2014-07-05 NOTE — Patient Instructions (Signed)
OK to stop the coreg, fluid pill and the lisinopril  Please take all new medication as prescribed- the labetolol (high dose) twice per day, and the high dose Avalide (which has a fluid pill in it)  Please continue to monitor your blood pressure on a regular basis as you do  Please continue all other medications as before  Please have the pharmacy call with any other refills you may need.  Please continue your efforts at being more active, low cholesterol diet, and weight control.  Please keep your appointments with your specialists as you may have planned  Please return to see Dr Yetta BarreJones in 2 weeks

## 2014-07-08 ENCOUNTER — Encounter: Payer: Self-pay | Admitting: Internal Medicine

## 2014-07-08 DIAGNOSIS — I10 Essential (primary) hypertension: Secondary | ICD-10-CM

## 2014-07-08 HISTORY — DX: Essential (primary) hypertension: I10

## 2014-07-08 NOTE — Assessment & Plan Note (Signed)
For claritin to continue, avoid decongestants to avoid BP aggrevation

## 2014-07-08 NOTE — Assessment & Plan Note (Signed)
For avalide as above, follow for any worsening s/s

## 2014-07-08 NOTE — Assessment & Plan Note (Signed)
stable overall by history and exam, recent data reviewed with pt, and pt to continue medical treatment as before,  to f/u any worsening symptoms or concerns Lab Results  Component Value Date   WBC 7.8 06/24/2014   HGB 13.6 06/24/2014   HCT 41.8 06/24/2014   PLT 256 06/24/2014   GLUCOSE 113* 06/24/2014   CHOL 172 08/07/2011   TRIG 123.0 08/07/2011   HDL 39.80 08/07/2011   LDLCALC 108* 08/07/2011   ALT 14 06/24/2014   AST 14 06/24/2014   NA 141 06/24/2014   K 3.8 06/24/2014   CL 103 06/24/2014   CREATININE 0.62 06/24/2014   BUN 9 06/24/2014   CO2 24 06/24/2014   TSH 1.99 11/27/2011   INR 0.92 06/23/2014

## 2014-07-08 NOTE — Assessment & Plan Note (Signed)
Pt frustrated over persistent hard to control BP, will attempt to improve by change coreg to labetolol with BB and alpha blocker effects, also change prinivil to avalide 300/12.5 qd , to help control edema as well

## 2014-07-11 ENCOUNTER — Inpatient Hospital Stay: Payer: 59 | Admitting: Family

## 2014-07-13 ENCOUNTER — Telehealth: Payer: Self-pay | Admitting: Internal Medicine

## 2014-07-13 MED ORDER — AMLODIPINE BESYLATE 5 MG PO TABS: 5.0000 mg | ORAL_TABLET | Freq: Every day | ORAL | Status: DC

## 2014-07-13 MED ORDER — DILTIAZEM HCL ER 240 MG PO CP24
240.0000 mg | ORAL_CAPSULE | Freq: Every day | ORAL | Status: DC
Start: 2014-07-13 — End: 2014-11-15

## 2014-07-13 NOTE — Telephone Encounter (Signed)
Pt saw Dr Jonny RuizJohn last week due to PCP n/a. At that time her BP were changed to new ones. Pt now experiencing swollen feet, and puffy ankles. She was taken off the diuretic at that time. She is not sure what to do as new meds are not working, BP this am 180/105. Pls advise.

## 2014-07-13 NOTE — Telephone Encounter (Signed)
Ok to add diltiazem ER 240 mg per day  Needs OV with Dr Posey ReaPlotnikov in 1 wk

## 2014-07-13 NOTE — Telephone Encounter (Signed)
Called pt no answer LMOM with detail msg with md response...Raechel Chute/lmb

## 2014-07-13 NOTE — Telephone Encounter (Signed)
Forwarding msg to Dr. Jonny RuizJohn since he rx med....Raechel Chute/lmb

## 2014-07-24 ENCOUNTER — Encounter: Payer: Self-pay | Admitting: Internal Medicine

## 2014-07-24 ENCOUNTER — Ambulatory Visit (INDEPENDENT_AMBULATORY_CARE_PROVIDER_SITE_OTHER): Payer: 59 | Admitting: Internal Medicine

## 2014-07-24 ENCOUNTER — Other Ambulatory Visit (INDEPENDENT_AMBULATORY_CARE_PROVIDER_SITE_OTHER): Payer: 59

## 2014-07-24 VITALS — BP 200/110 | HR 62 | Temp 97.5°F | Resp 16 | Ht 69.0 in | Wt 189.0 lb

## 2014-07-24 DIAGNOSIS — R0683 Snoring: Secondary | ICD-10-CM

## 2014-07-24 DIAGNOSIS — I10 Essential (primary) hypertension: Secondary | ICD-10-CM

## 2014-07-24 LAB — URINALYSIS, ROUTINE W REFLEX MICROSCOPIC
BILIRUBIN URINE: NEGATIVE
HGB URINE DIPSTICK: NEGATIVE
KETONES UR: NEGATIVE
LEUKOCYTES UA: NEGATIVE
Nitrite: NEGATIVE
RBC / HPF: NONE SEEN (ref 0–?)
Specific Gravity, Urine: 1.01 (ref 1.000–1.030)
Total Protein, Urine: NEGATIVE
URINE GLUCOSE: NEGATIVE
UROBILINOGEN UA: 0.2 (ref 0.0–1.0)
pH: 6 (ref 5.0–8.0)

## 2014-07-24 LAB — BASIC METABOLIC PANEL
BUN: 8 mg/dL (ref 6–23)
CO2: 29 mEq/L (ref 19–32)
Calcium: 9 mg/dL (ref 8.4–10.5)
Chloride: 106 mEq/L (ref 96–112)
Creatinine, Ser: 0.8 mg/dL (ref 0.4–1.2)
GFR: 97.01 mL/min (ref >60.00)
GLUCOSE: 98 mg/dL (ref 70–99)
POTASSIUM: 3.7 meq/L (ref 3.5–5.1)
SODIUM: 141 meq/L (ref 135–145)

## 2014-07-24 LAB — CBC WITH DIFFERENTIAL/PLATELET
BASOS ABS: 0 10*3/uL (ref 0.0–0.1)
Basophils Relative: 0.6 % (ref 0.0–3.0)
EOS PCT: 2.5 % (ref 0.0–5.0)
Eosinophils Absolute: 0.2 10*3/uL (ref 0.0–0.7)
HCT: 40.9 % (ref 36.0–46.0)
HEMOGLOBIN: 13.3 g/dL (ref 12.0–15.0)
Lymphocytes Relative: 35.7 % (ref 12.0–46.0)
Lymphs Abs: 2.1 10*3/uL (ref 0.7–4.0)
MCHC: 32.5 g/dL (ref 30.0–36.0)
MCV: 88.5 fl (ref 78.0–100.0)
MONOS PCT: 6.6 % (ref 3.0–12.0)
Monocytes Absolute: 0.4 10*3/uL (ref 0.1–1.0)
Neutro Abs: 3.2 10*3/uL (ref 1.4–7.7)
Neutrophils Relative %: 54.6 % (ref 43.0–77.0)
PLATELETS: 289 10*3/uL (ref 150.0–400.0)
RBC: 4.62 Mil/uL (ref 3.87–5.11)
RDW: 14.8 % (ref 11.5–15.5)
WBC: 5.9 10*3/uL (ref 4.0–10.5)

## 2014-07-24 LAB — T4, FREE: Free T4: 0.67 ng/dL (ref 0.60–1.60)

## 2014-07-24 LAB — T4: T4, Total: 8.1 ug/dL (ref 4.5–12.0)

## 2014-07-24 LAB — T3, FREE: T3, Free: 3.1 pg/mL (ref 2.3–4.2)

## 2014-07-24 LAB — TSH: TSH: 0.03 u[IU]/mL — AB (ref 0.35–4.50)

## 2014-07-24 MED ORDER — IRBESARTAN 300 MG PO TABS: 300.0000 mg | ORAL_TABLET | Freq: Every day | ORAL | Status: DC

## 2014-07-24 MED ORDER — TRIAMTERENE-HCTZ 37.5-25 MG PO CAPS: 1.0000 | ORAL_CAPSULE | Freq: Every day | ORAL | Status: DC

## 2014-07-24 NOTE — Progress Notes (Signed)
Subjective:    Patient ID: Cynthia Hendrix, female    DOB: 1960/08/07, 53 y.o.   MRN: [id number]  Hypertension This is a chronic problem. The current episode started more than 1 year ago. The problem has been gradually worsening since onset. The problem is uncontrolled. Pertinent negatives include no anxiety, blurred vision, chest pain, headaches, malaise/fatigue, neck pain, orthopnea, palpitations, peripheral edema, PND, shortness of breath or sweats. There are no associated agents to hypertension. Risk factors for coronary artery disease include smoking/tobacco exposure. Past treatments include beta blockers, calcium channel blockers, diuretics and angiotensin blockers. The current treatment provides mild improvement. Compliance problems include diet and exercise.       Review of Systems  Constitutional: Negative.  Negative for fever, chills, malaise/fatigue, diaphoresis, appetite change and fatigue.  HENT: Negative.   Eyes: Negative.  Negative for blurred vision.  Respiratory: Negative.  Negative for cough, choking, chest tightness, shortness of breath and stridor.   Cardiovascular: Negative.  Negative for chest pain, palpitations, orthopnea, leg swelling and PND.  Gastrointestinal: Negative.  Negative for nausea, vomiting, abdominal pain, diarrhea, constipation and blood in stool.  Endocrine: Negative.   Genitourinary: Negative.  Negative for dysuria, urgency, frequency, hematuria, decreased urine volume, enuresis and difficulty urinating.  Musculoskeletal: Negative.  Negative for back pain and neck pain.  Skin: Negative.  Negative for rash.  Allergic/Immunologic: Negative.   Neurological: Negative.  Negative for dizziness, tremors, syncope, light-headedness, numbness and headaches.  Hematological: Negative.  Negative for adenopathy. Does not bruise/bleed easily.  Psychiatric/Behavioral: Negative.        Objective:   Physical Exam  Constitutional: She is oriented to person, place,  and time. She appears well-developed and well-nourished. No distress.  HENT:  Head: Normocephalic and atraumatic.  Mouth/Throat: Oropharynx is clear and moist. No oropharyngeal exudate.  Eyes: Conjunctivae are normal. Right eye exhibits no discharge. Left eye exhibits no discharge. No scleral icterus.  Neck: Normal range of motion. Neck supple. No JVD present. No tracheal deviation present. No thyromegaly present.  Cardiovascular: Normal rate, regular rhythm, normal heart sounds and intact distal pulses.  Exam reveals no gallop and no friction rub.   No murmur heard. Pulmonary/Chest: Effort normal and breath sounds normal. No stridor. No respiratory distress. She has no wheezes. She has no rales. She exhibits no tenderness.  Abdominal: Soft. Bowel sounds are normal. She exhibits no distension and no mass. There is no tenderness. There is no rebound and no guarding.  Musculoskeletal: Normal range of motion. She exhibits no edema or tenderness.  Lymphadenopathy:    She has no cervical adenopathy.  Neurological: She is oriented to person, place, and time.  Skin: Skin is warm and dry. No rash noted. She is not diaphoretic. No erythema. No pallor.  Psychiatric: She has a normal mood and affect. Her behavior is normal. Judgment and thought content normal.  Vitals reviewed.    Lab Results  Component Value Date   WBC 5.9 07/24/2014   HGB 13.3 07/24/2014   HCT 40.9 07/24/2014   PLT 289.0 07/24/2014   GLUCOSE 98 07/24/2014   CHOL 172 08/07/2011   TRIG 123.0 08/07/2011   HDL 39.80 08/07/2011   LDLCALC 108* 08/07/2011   ALT 14 06/24/2014   AST 14 06/24/2014   NA 141 07/24/2014   K 3.7 07/24/2014   CL 106 07/24/2014   CREATININE 0.8 07/24/2014   BUN 8 07/24/2014   CO2 29 07/24/2014   TSH 0.03* 07/24/2014   INR 0.92 06/23/2014  Assessment & Plan:

## 2014-07-24 NOTE — Patient Instructions (Signed)

## 2014-07-27 ENCOUNTER — Encounter: Payer: Self-pay | Admitting: Internal Medicine

## 2014-07-29 NOTE — Assessment & Plan Note (Addendum)
Her BP is not well controlled I will check her labs to screen for end organ damage and secondary causes of HTN She has a history of heavy snoring so will send for a sleep evaluation Will upgrade her diuretic to dyazide She will cont the other meds for HTN I have asked her to quit smoking and to work on her lifestyle modifications

## 2014-08-06 ENCOUNTER — Ambulatory Visit: Payer: 59 | Admitting: Internal Medicine

## 2014-09-07 ENCOUNTER — Ambulatory Visit (INDEPENDENT_AMBULATORY_CARE_PROVIDER_SITE_OTHER): Payer: 59 | Admitting: Internal Medicine

## 2014-09-07 ENCOUNTER — Other Ambulatory Visit (INDEPENDENT_AMBULATORY_CARE_PROVIDER_SITE_OTHER): Payer: 59

## 2014-09-07 VITALS — BP 130/98 | HR 79 | Temp 98.7°F | Resp 16 | Ht 69.0 in | Wt 191.0 lb

## 2014-09-07 DIAGNOSIS — H9311 Tinnitus, right ear: Secondary | ICD-10-CM

## 2014-09-07 DIAGNOSIS — R040 Epistaxis: Secondary | ICD-10-CM

## 2014-09-07 DIAGNOSIS — Z1211 Encounter for screening for malignant neoplasm of colon: Secondary | ICD-10-CM

## 2014-09-07 DIAGNOSIS — R0683 Snoring: Secondary | ICD-10-CM

## 2014-09-07 DIAGNOSIS — I10 Essential (primary) hypertension: Secondary | ICD-10-CM

## 2014-09-07 DIAGNOSIS — J3489 Other specified disorders of nose and nasal sinuses: Secondary | ICD-10-CM

## 2014-09-07 LAB — BASIC METABOLIC PANEL
BUN: 14 mg/dL (ref 6–23)
CHLORIDE: 102 meq/L (ref 96–112)
CO2: 36 meq/L — AB (ref 19–32)
CREATININE: 1.15 mg/dL (ref 0.40–1.20)
Calcium: 9.6 mg/dL (ref 8.4–10.5)
GFR: 63.79 mL/min (ref >60.00)
GLUCOSE: 129 mg/dL — AB (ref 70–99)
Potassium: 3.5 mEq/L (ref 3.5–5.1)
Sodium: 140 mEq/L (ref 135–145)

## 2014-09-07 NOTE — Progress Notes (Signed)
Pre visit review using our clinic review tool, if applicable. No additional management support is needed unless otherwise documented below in the visit note. 

## 2014-09-07 NOTE — Patient Instructions (Signed)
1. We are going to have [facility] scheduled [facility] to have them look at your nose as well as the ringing in the ears.   2. We will send a message so that you can get your colonoscopy scheduled for after March.   3. We will check on the blood work today to make sure the medicine changes have not affected the kidneys.   4. We will see you back in about 3 months to check and make sure that the blood pressure is still doing better.   Exercise to Lose Weight Exercise and a healthy diet may help you lose weight. Your doctor may suggest specific exercises. EXERCISE IDEAS AND TIPS  Choose low-cost things you enjoy doing, such as walking, bicycling, or exercising to workout videos.  Take stairs instead of the elevator.  Walk during your lunch break.  Park your car further away from work or school.  Go to a gym or an exercise class.  Start with 5 to 10 minutes of exercise each day. Build up to 30 minutes of exercise 4 to 6 days a week.  Wear shoes with good support and comfortable clothes.  Stretch before and after working out.  Work out until you breathe harder and your heart beats faster.  Drink extra water when you exercise.  Do not do so much that you hurt yourself, feel dizzy, or get very short of breath. Exercises that burn about 150 calories:  Running 1  miles in 15 minutes.  Playing volleyball for 45 to 60 minutes.  Washing and waxing a car for 45 to 60 minutes.  Playing touch football for 45 minutes.  Walking 1  miles in 35 minutes.  Pushing a stroller 1  miles in 30 minutes.  Playing basketball for 30 minutes.  Raking leaves for 30 minutes.  Bicycling 5 miles in 30 minutes.  Walking 2 miles in 30 minutes.  Dancing for 30 minutes.  Shoveling snow for 15 minutes.  Swimming laps for 20 minutes.  Walking up stairs for 15 minutes.  Bicycling 4 miles in 15 minutes.  Gardening for 30 to 45 minutes.  Jumping rope for 15 minutes.  Washing windows  or floors for 45 to 60 minutes. Document Released: 08/15/2010 Document Revised: 10/05/2011 Document Reviewed: 08/15/2010 [facility] Patient Information 2015 [employer] [place]. This information is not intended to replace advice given to you by your health care provider. Make sure you discuss any questions you have with your health care provider.

## 2014-09-08 ENCOUNTER — Encounter: Payer: Self-pay | Admitting: Internal Medicine

## 2014-09-08 NOTE — Assessment & Plan Note (Signed)
Patient denies that she snores.

## 2014-09-08 NOTE — Assessment & Plan Note (Signed)
Only workup for malignant hypertension not done is aldosterone to renin ratio (will hold off today as BP is controlled). She also has not had a sleep study but she adamantly denies snoring today so will also hold off. When explained that OSA can cause hard to control BPs she seemed more willing to get the study if needed in the future. Continue HCTZ 25, irbesartan 300 mg daily, labetalol 300 mg bid. Given intolerance to clonidine and amlodipine would likely have to add hydralazine if needs another agent.

## 2014-09-08 NOTE — Assessment & Plan Note (Signed)
Referral to different ENT for check of the area and making sure no problems prior to dental work. She is also having mild tinnitus in right ear which can be evaluated at the same time.

## 2014-09-08 NOTE — Progress Notes (Signed)
   Subjective:    Patient ID: Cynthia Hendrix, female    DOB: 15-Apr-1961, 54 y.o.   MRN: [id number]  HPI The patient is a 54 YO female who is coming in for nose pain. She had a bad nose bleed which was cauterized last fall. 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 Now she gets sharp pains at times that last about 10 minutes 10/10 pain constant stabbing. They come 1-2 times per month and more at night time although they can happen during the day. She knows that she needs some dental work done and usually after these episodes she gets tooth pain. She denies nose bleed or drainage. She denies fevers, chills, congestion. She is very hesitant about getting the dental work done because she is not sure about her nose. She also had change to her blood pressure medicine and is doing well. No side effects and her blood pressure is much better today.  Review of Systems  Constitutional: Negative for fever, activity change, appetite change, fatigue and unexpected weight change.  HENT: Positive for dental problem. Negative for congestion, ear pain, nosebleeds, sinus pressure, sneezing and sore throat.        Nose pain  Eyes: Negative.   Respiratory: Negative for cough, chest tightness, shortness of breath and wheezing.   Cardiovascular: Negative for chest pain, palpitations and leg swelling.  Gastrointestinal: Negative.   Neurological: Negative for headaches.  Psychiatric/Behavioral: Negative.       Objective:   Physical Exam  Constitutional: She is oriented to person, place, and time. She appears well-developed and well-nourished.  HENT:  Head: Normocephalic and atraumatic.  Right Ear: External ear normal.  Left Ear: External ear normal.  Nose: Nose normal.  Mouth/Throat: Oropharynx is clear and moist.  Eyes: EOM are normal.  Neck: Normal range of motion.  Cardiovascular: Normal rate and regular rhythm.   Pulmonary/Chest: Effort normal and  breath sounds normal. No respiratory distress. She has no wheezes.  Abdominal: Soft. Bowel sounds are normal. She exhibits no distension. There is no tenderness.  Musculoskeletal: She exhibits no edema.  Neurological: She is alert and oriented to person, place, and time.  Skin: Skin is warm and dry.    Filed Vitals:   09/07/14 1544  BP: 130/98  Pulse: 79  Temp: 98.7 F (37.1 C)  TempSrc: Oral  Resp: 16  Height: 5\' 9"  (1.753 m)  Weight: 191 lb (86.637 kg)  SpO2: 97%      Assessment & Plan:

## 2014-09-10 ENCOUNTER — Encounter: Payer: Self-pay | Admitting: Gastroenterology

## 2014-11-12 ENCOUNTER — Encounter: Payer: 59 | Admitting: Gastroenterology

## 2014-11-15 ENCOUNTER — Ambulatory Visit (INDEPENDENT_AMBULATORY_CARE_PROVIDER_SITE_OTHER): Payer: 59 | Admitting: Internal Medicine

## 2014-11-15 ENCOUNTER — Other Ambulatory Visit (INDEPENDENT_AMBULATORY_CARE_PROVIDER_SITE_OTHER): Payer: 59

## 2014-11-15 ENCOUNTER — Encounter: Payer: Self-pay | Admitting: Internal Medicine

## 2014-11-15 ENCOUNTER — Telehealth: Payer: Self-pay | Admitting: Internal Medicine

## 2014-11-15 VITALS — BP 138/82 | HR 40 | Temp 97.8°F | Wt 188.8 lb

## 2014-11-15 DIAGNOSIS — I10 Essential (primary) hypertension: Secondary | ICD-10-CM

## 2014-11-15 LAB — COMPREHENSIVE METABOLIC PANEL
ALT: 11 U/L (ref 0–35)
AST: 14 U/L (ref 0–37)
Albumin: 4.4 g/dL (ref 3.5–5.2)
Alkaline Phosphatase: 76 U/L (ref 39–117)
BUN: 11 mg/dL (ref 6–23)
CO2: 28 meq/L (ref 19–32)
Calcium: 9.8 mg/dL (ref 8.4–10.5)
Chloride: 102 mEq/L (ref 96–112)
Creatinine, Ser: 1.07 mg/dL (ref 0.40–1.20)
GFR: 69.27 mL/min (ref >60.00)
GLUCOSE: 98 mg/dL (ref 70–99)
POTASSIUM: 4 meq/L (ref 3.5–5.1)
Sodium: 137 mEq/L (ref 135–145)
Total Bilirubin: 0.6 mg/dL (ref 0.2–1.2)
Total Protein: 7.9 g/dL (ref 6.0–8.3)

## 2014-11-15 LAB — MAGNESIUM: Magnesium: 2.3 mg/dL (ref 1.5–2.5)

## 2014-11-15 LAB — CK: CK TOTAL: 157 U/L (ref 7–177)

## 2014-11-15 MED ORDER — CYCLOBENZAPRINE HCL 5 MG PO TABS: 5.0000 mg | ORAL_TABLET | Freq: Three times a day (TID) | ORAL | Status: DC | PRN

## 2014-11-15 MED ORDER — TRIAMTERENE-HCTZ 37.5-25 MG PO TABS: 1.0000 | ORAL_TABLET | Freq: Every day | ORAL | Status: DC

## 2014-11-15 NOTE — Patient Instructions (Signed)
We have sent in a tablet of the same dose of the triamterene/hctz that you have. Cut it in half and take 1/2 pill daily. We will check on the labs today and call you back with the results.   We will see you back in about 1 month to see how your blood pressure is doing. If it goes up before then call us.   We have also sent in a muscle relaxer called flexeril that you can take 1 pill up to 3 times a day for muscle pains and cramps.

## 2014-11-15 NOTE — Progress Notes (Signed)
Patient received education resource, including the self-management goal and tool. Patient verbalized understanding. 

## 2014-11-15 NOTE — Assessment & Plan Note (Signed)
Patient admits she had not been taking her medicines. She is actually on a lower dose of HCTZ than she was in the past (used to be on 50 mg). Will change to 1/2 pill of triamterene/hctz (12.5 mg daily) and continue labetalol and irbesartan. This has been the only medicine to bring her BPs down. Will have her check at home and if they elevate we will add additional agent. She will return in 1 month. Checking CMP, magnesium, CK today for other etiology of the cramps.

## 2014-11-15 NOTE — Progress Notes (Signed)
   Subjective:    Patient ID: Cynthia Hendrix, female    DOB: September 28, 1960, 54 y.o.   MRN: [id number]  HPI The patient is a 54 YO female coming in for muscle cramps. Since being on the hctz triamterene in December she has noticed more cramps. She thought it was normal but in the last 2 months they have become more severe and more frequent. She gets 2-3 per day. Takes mustard which relieves it within 5 minutes. Her muscles are sore the rest of the day however (6/10 pain). At peak the cramps are 10/10 in intensity. It scares her that they are more often lately. No recurrent nosebleeds.   Review of Systems  Constitutional: Negative for fever, activity change, appetite change, fatigue and unexpected weight change.  HENT: Negative for nosebleeds.   Respiratory: Negative for cough, chest tightness, shortness of breath and wheezing.   Cardiovascular: Negative for chest pain, palpitations and leg swelling.  Gastrointestinal: Negative.   Musculoskeletal: Positive for myalgias. Negative for back pain and arthralgias.  Neurological: Negative for headaches.      Objective:   Physical Exam  Constitutional: She is oriented to person, place, and time. She appears well-developed and well-nourished.  HENT:  Head: Normocephalic and atraumatic.  Eyes: EOM are normal. Pupils are equal, round, and reactive to light.  Neck: Normal range of motion.  Cardiovascular: Normal rate and regular rhythm.   Pulmonary/Chest: Effort normal and breath sounds normal. No respiratory distress. She has no wheezes.  Abdominal: Soft. Bowel sounds are normal. She exhibits no distension. There is no tenderness.  Musculoskeletal: She exhibits no edema.  Neurological: She is alert and oriented to person, place, and time.   Filed Vitals:   11/15/14 1348  BP: 138/82  Pulse: 40  Temp: 97.8 F (36.6 C)  Weight: 188 lb 12 oz (85.616 kg)  SpO2: 98%      Assessment & Plan:

## 2014-11-15 NOTE — Telephone Encounter (Signed)
PLEASE NOTE: All timestamps contained within this report are represented as [place]. CONFIDENTIALTY NOTICE: This fax transmission is intended only for the addressee. It contains information that is legally privileged, confidential or otherwise protected from use or disclosure. If you are not the intended recipient, you are strictly prohibited from reviewing, disclosing, copying using or disseminating any of this information or taking any action in reliance on or regarding this information. If you have received this fax in error, please notify us immediately by telephone so that we can arrange for its return to us. Phone: [phone], Toll-Free: [phone], Fax: [phone] Page: 1 of 1 Call Id: [id number] [facility] - Client TELEPHONE ADVICE RECORD [facility] Patient Name: Cynthia Hendrix DOB: 07/12/1961 Initial Comment Caller states she has been having muscle spasms, starts in her ankles, than her thighs. What can she do for them. Nurse Assessment Nurse: Elijah Birkaldwell, RN, Lynda Date/Time (Eastern Time): 11/15/2014 10:42:06 AM Confirm and document reason for call. If symptomatic, describe symptoms. ---Caller states she has been having muscle spasms, starts in her ankles, than her thighs. What can she do for them? Taking yellow mustard which usually eases them immediately. Last night had them through the night. Even now, her hands are cramping. She has been having symptoms for the last couple weeks, getting worse. Thinks it may be her magnesium level or potassium. She is on BP rx/diuretics. Has the patient traveled out of the country within the last 30 days? ---Not Applicable Does the patient require triage? ---Yes Related visit to physician within the last 2 weeks? ---No Does the PT have any chronic conditions? (i.e. diabetes, asthma, etc.) ---Yes List chronic conditions. ---on BP rx. Did the patient indicate they were pregnant?  ---No Guidelines Guideline Title Affirmed Question Affirmed Notes Leg Pain [1] Leg pain which occurs after walking a certain distance AND [2] disappears with rest AND [3] age > 5950 Final Disposition User See PCP When Office is Open (within 3 days) Elijah Birkaldwell, Charity fundraiserN, [employer]ys[employer]Lynda Comments Caller states these muscle spasms/cramps come on suddenly affecting different areas of her body (ankles, thighs, forearms, hands, calves) and they last a couple minutes. When she takes yellow mustard, that almost immediately takes the cramp away, however she has lingering soreness afterwards. She is on several BP rxs, no recent changes in dosages & her blood work done in March was fine.

## 2014-11-19 ENCOUNTER — Telehealth: Payer: Self-pay | Admitting: *Deleted

## 2014-11-19 NOTE — Telephone Encounter (Signed)
[facility] - Client TELEPHONE ADVICE RECORD [facility] Patient Name: Cynthia PennaGLORIA Hendrix Gender: Female DOB: 03/18/1961 Age: [age] Y 11 M 8 D Return Phone Number: [phone] (Primary) Address: City/State/Zip: [place] Client [facility] - Client Client Site  [facility] Physician Genella MechKollar, Elizabeth Contact Type Call Call Type Triage / Clinical Caller Name Cynthia PennaGloria Laatsch Relationship To Patient Self Appointment Disposition EMR Appointment Scheduled Info pasted into Epic Yes Return Phone Number [phone] (Primary) Chief Complaint Muscle Jerks, Tics And Shudders Initial Comment Caller states she has been having muscle spasms, starts in her ankles, than her thighs. What can she do for them. PreDisposition Call Doctor Nurse Assessment Nurse: Elijah Birkaldwell, RN, Stark BrayLynda Date/Time Lamount Cohen(Eastern Time): 11/15/2014 10:42:06 AM Confirm and document reason for call. If symptomatic, describe symptoms. ---Caller states she has been having muscle spasms, starts in her ankles, than her thighs. What can she do for them? Taking yellow mustard which usually eases them immediately. Last night had them through the night. Even now, her hands are cramping. She has been having symptoms for the last couple weeks, getting worse. Thinks it may be her magnesium level or potassium. She is on BP rx/diuretics. Has the patient traveled out of the country within the last 30 days? ---Not Applicable Does the patient require triage? ---Yes Related visit to physician within the last 2 weeks? ---No Does the PT have any chronic conditions? (i.e. diabetes, asthma, etc.) ---Yes List chronic conditions. ---on BP rx. Did the patient indicate they were pregnant? ---No Guidelines Guideline Title Affirmed Question Affirmed Notes Nurse Date/Time Lamount Cohen(Eastern Time) Leg Pain [1] Leg pain which occurs after walking a certain distance AND [2] disappears with rest AND [3]  age > 1650 Elijah BirkCaldwell, RN, Stark BrayLynda 11/15/2014 10:47:35 AM PLEASE NOTE: All timestamps contained within this report are represented as [place] Standard [place]. CONFIDENTIALTY NOTICE: This fax transmission is intended only for the addressee. It contains information that is legally privileged, confidential or otherwise protected from use or disclosure. If you are not the intended recipient, you are strictly prohibited from reviewing, disclosing, copying using or disseminating any of this information or taking any action in reliance on or regarding this information. If you have received this fax in error, please notify us immediately by telephone so that we can arrange for its return to us. Phone: [phone], Toll-Free: [phone], Fax: [phone] Page: 2 of 2 Call Id: [id number] Disp. Time Lamount Cohen(Eastern Time) Disposition Final User 11/15/2014 10:54:41 AM See PCP When Office is Open (within 3 days) Yes Elijah Birkaldwell, RN, Irene ShipperLynda Caller Understands: Yes Disagree/Comply: Comply Care Advice Given Per Guideline

## 2014-12-06 ENCOUNTER — Ambulatory Visit: Payer: 59 | Admitting: Internal Medicine

## 2015-02-27 ENCOUNTER — Encounter: Payer: Self-pay | Admitting: Obstetrics

## 2015-02-27 ENCOUNTER — Ambulatory Visit (INDEPENDENT_AMBULATORY_CARE_PROVIDER_SITE_OTHER): Payer: 59 | Admitting: Obstetrics

## 2015-02-27 VITALS — BP 226/136 | HR 93 | Temp 98.0°F | Ht 69.0 in | Wt 197.6 lb

## 2015-02-27 DIAGNOSIS — N951 Menopausal and female climacteric states: Secondary | ICD-10-CM

## 2015-02-28 ENCOUNTER — Ambulatory Visit: Payer: 59 | Admitting: Obstetrics

## 2015-02-28 NOTE — Progress Notes (Signed)
Patient rescheduled

## 2015-03-06 ENCOUNTER — Other Ambulatory Visit (INDEPENDENT_AMBULATORY_CARE_PROVIDER_SITE_OTHER): Payer: 59

## 2015-03-06 ENCOUNTER — Ambulatory Visit (INDEPENDENT_AMBULATORY_CARE_PROVIDER_SITE_OTHER): Payer: 59 | Admitting: Internal Medicine

## 2015-03-06 ENCOUNTER — Encounter: Payer: Self-pay | Admitting: Internal Medicine

## 2015-03-06 VITALS — BP 230/138 | HR 95 | Wt 193.0 lb

## 2015-03-06 DIAGNOSIS — N951 Menopausal and female climacteric states: Secondary | ICD-10-CM | POA: Diagnosis not present

## 2015-03-06 DIAGNOSIS — R232 Flushing: Secondary | ICD-10-CM | POA: Diagnosis not present

## 2015-03-06 DIAGNOSIS — F418 Other specified anxiety disorders: Secondary | ICD-10-CM

## 2015-03-06 DIAGNOSIS — I10 Essential (primary) hypertension: Secondary | ICD-10-CM

## 2015-03-06 LAB — BASIC METABOLIC PANEL
BUN: 15 mg/dL (ref 6–23)
CO2: 32 mEq/L (ref 19–32)
Calcium: 9.9 mg/dL (ref 8.4–10.5)
Chloride: 101 mEq/L (ref 96–112)
Creatinine, Ser: 0.98 mg/dL (ref 0.40–1.20)
GFR: 76.57 mL/min (ref >60.00)
Glucose, Bld: 107 mg/dL — ABNORMAL HIGH (ref 70–99)
Potassium: 3.4 mEq/L — ABNORMAL LOW (ref 3.5–5.1)
Sodium: 141 mEq/L (ref 135–145)

## 2015-03-06 LAB — FOLLICLE STIMULATING HORMONE: FSH: 49.4 m[IU]/mL

## 2015-03-06 LAB — T4, FREE: Free T4: 0.87 ng/dL (ref 0.60–1.60)

## 2015-03-06 LAB — TSH: TSH: 2.67 u[IU]/mL (ref 0.35–4.50)

## 2015-03-06 MED ORDER — PAROXETINE HCL 20 MG PO TABS: 20.0000 mg | ORAL_TABLET | Freq: Every day | ORAL | Status: DC

## 2015-03-06 MED ORDER — SPIRONOLACTONE 50 MG PO TABS: 50.0000 mg | ORAL_TABLET | Freq: Every day | ORAL | Status: DC

## 2015-03-06 MED ORDER — ZOLPIDEM TARTRATE 10 MG PO TABS: 5.0000 mg | ORAL_TABLET | Freq: Every evening | ORAL | Status: DC | PRN

## 2015-03-06 NOTE — Assessment & Plan Note (Signed)
Worse again Add Spironolactone Labs

## 2015-03-06 NOTE — Progress Notes (Signed)
  Subjective:  C/o high BP in spite of taking meds. Cynthia Hendrix has done well up to 1 mo ago. No LE swelling. Cynthia Hendrix's sister in law died this am of a massive MI. Pt is upset... C/o hot flashes - not sleeping. She is not smoking!  HPI PMHx: 1. Pituitary neoplasm.  2. Hypertension.  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     BP Readings from Last 3 Encounters:  03/06/15 230/138  02/27/15 226/136  11/15/14 138/82    Review of Systems  Constitutional: Positive for fever, diaphoresis and fatigue. Negative for chills, activity change, appetite change and unexpected weight change.  HENT: Negative for congestion, mouth sores and sinus pressure.   Eyes: Negative for visual disturbance.  Respiratory: Negative for cough and chest tightness.   Cardiovascular: Positive for leg swelling.  Gastrointestinal: Negative for nausea, vomiting and abdominal pain.  Genitourinary: Negative for frequency, difficulty urinating and vaginal pain.  Musculoskeletal: Negative for back pain and gait problem.  Skin: Negative for pallor and rash.  Neurological: Positive for light-headedness. Negative for dizziness, tremors, weakness and numbness.  Psychiatric/Behavioral: Positive for sleep disturbance and decreased concentration. Negative for suicidal ideas and confusion. The patient is nervous/anxious.        Objective:   Physical Exam  Constitutional: She appears well-developed. No distress.  HENT:  Head: Normocephalic.  Right Ear: External ear normal.  Left Ear: External ear normal.  Nose: Nose normal.  Mouth/Throat: Oropharynx is clear and moist.  Eyes: Conjunctivae are normal. Pupils are equal, round, and reactive to light. Right eye exhibits no discharge. Left eye exhibits no discharge.  Neck: Normal range of motion. Neck supple. No JVD present. No tracheal deviation present. No thyromegaly present.  Cardiovascular: Normal rate, regular rhythm and normal heart sounds.   Pulmonary/Chest: No  stridor. No respiratory distress. She has no wheezes.  Abdominal: Soft. Bowel sounds are normal. She exhibits no distension and no mass. There is no tenderness. There is no rebound and no guarding.  Musculoskeletal: She exhibits no edema (B 1+) or tenderness.  Lymphadenopathy:    She has no cervical adenopathy.  Neurological: She displays normal reflexes. No cranial nerve deficit. She exhibits normal muscle tone. Coordination normal.  Skin: No rash noted. No erythema.  Psychiatric: Judgment and thought content normal.  Tearful, upset Chart reviewed   Lab Results  Component Value Date   WBC 5.9 07/24/2014   HGB 13.3 07/24/2014   HCT 40.9 07/24/2014   PLT 289.0 07/24/2014   GLUCOSE 107* 03/06/2015   CHOL 172 08/07/2011   TRIG 123.0 08/07/2011   HDL 39.80 08/07/2011   LDLCALC 108* 08/07/2011   ALT 11 11/15/2014   AST 14 11/15/2014   NA 141 03/06/2015   K 3.4* 03/06/2015   CL 101 03/06/2015   CREATININE 0.98 03/06/2015   BUN 15 03/06/2015   CO2 32 03/06/2015   TSH 2.67 03/06/2015   INR 0.92 06/23/2014       Assessment:         Plan:

## 2015-03-06 NOTE — Assessment & Plan Note (Signed)
Grief Add Paroxetine Zolpidem prn

## 2015-03-06 NOTE — Progress Notes (Signed)
Pre visit review using our clinic review tool, if applicable. No additional management support is needed unless otherwise documented below in the visit note. 

## 2015-03-12 ENCOUNTER — Telehealth: Payer: Self-pay | Admitting: Internal Medicine

## 2015-03-12 NOTE — Telephone Encounter (Signed)
Pt called and said that she seen plot last week and he added another med.  She said that she is cramping and she cant take it.  She needs to know what to do because her BP is back up ?  Can you call her when you get a chance?    Best number [phone]

## 2015-03-13 NOTE — Telephone Encounter (Signed)
Plotnikov increased maxzide from one half tablet daily to a whole tablet daily and started patient on spironolactone  last week. She said she has been cramping ever since. She stopped the spironolactone on Monday of this week and went back down to a half a tablet daily of the maxzide. She says the cramps are gone and her blood pressure has been in the 140's. What should she do? Please advise, thanks.

## 2015-03-14 NOTE — Telephone Encounter (Signed)
Patient aware and will schedule an office visit.  

## 2015-03-14 NOTE — Telephone Encounter (Signed)
Yes, this low potassium is barely out of range. Would recommend the whole pill and come back in 3-4 weeks for visit and labs.

## 2015-03-14 NOTE — Telephone Encounter (Signed)
Patient wants to know if you had a chance to review her recent labs before making the decision for her to go back up to a whole pill because her potassium was low. She also said nobody gave her any instructions as to what to do about her potassium. Please advise, thanks.

## 2015-03-14 NOTE — Telephone Encounter (Signed)
Would recommend to stay off the spironolactone and try going back up to 1 pill a day of the maxzide.

## 2015-03-16 LAB — ALDOSTERONE + RENIN ACTIVITY W/ RATIO
ALDO / PRA Ratio: 78.3 Ratio — ABNORMAL HIGH (ref 0.9–28.9)
Aldosterone: 18 ng/dL
PRA LC/MS/MS: 0.23 ng/mL/h — AB (ref 0.25–5.82)

## 2015-05-09 ENCOUNTER — Ambulatory Visit: Payer: 59 | Admitting: Internal Medicine

## 2015-05-13 ENCOUNTER — Ambulatory Visit: Payer: 59 | Admitting: Internal Medicine

## 2015-05-13 DIAGNOSIS — Z0289 Encounter for other administrative examinations: Secondary | ICD-10-CM

## 2015-05-18 ENCOUNTER — Other Ambulatory Visit: Payer: Self-pay | Admitting: Internal Medicine

## 2015-07-11 ENCOUNTER — Telehealth: Payer: Self-pay | Admitting: Internal Medicine

## 2015-07-11 NOTE — Telephone Encounter (Signed)
Melatonin or benadryl over the counter are for sleep or zzquil as well.

## 2015-07-11 NOTE — Telephone Encounter (Signed)
Pt called state Dr. Macario GoldsPlot gave her Janit Bernmbian to help her sleep. Pt stated she only got 1 pill left and this med does not help her stay as sleep 100%. Pt state she does not want to continue to take this med due to side effect of it and she was wondering what Dr. Okey Duprerawford recommend (somethig OTC). Please advise, pt will be out of this med tonight and she is worry that she might not be able to go sleep with out anything to help her.

## 2015-07-11 NOTE — Telephone Encounter (Signed)
Patient aware and will go to the pharmacy and pick up one of those medications.

## 2015-07-15 ENCOUNTER — Ambulatory Visit: Payer: 59 | Admitting: Internal Medicine

## 2015-07-26 ENCOUNTER — Telehealth: Payer: Self-pay | Admitting: Internal Medicine

## 2015-07-26 DIAGNOSIS — I10 Essential (primary) hypertension: Secondary | ICD-10-CM

## 2015-07-26 MED ORDER — LABETALOL HCL 300 MG PO TABS: 300.0000 mg | ORAL_TABLET | Freq: Two times a day (BID) | ORAL | Status: DC

## 2015-07-26 MED ORDER — IRBESARTAN 300 MG PO TABS: 300.0000 mg | ORAL_TABLET | Freq: Every day | ORAL | Status: DC

## 2015-07-26 NOTE — Telephone Encounter (Signed)
Sent in

## 2015-07-26 NOTE — Telephone Encounter (Signed)
Pt request refill for labetalol (NORMODYNE) 300 MG tablet and Avapro to be send to [facility].  Please call pt once its done.

## 2015-09-12 ENCOUNTER — Ambulatory Visit (INDEPENDENT_AMBULATORY_CARE_PROVIDER_SITE_OTHER): Payer: 59 | Admitting: Internal Medicine

## 2015-09-12 ENCOUNTER — Encounter: Payer: Self-pay | Admitting: Internal Medicine

## 2015-09-12 VITALS — BP 150/100 | HR 72 | Temp 98.3°F | Resp 15 | Ht 69.0 in | Wt 180.1 lb

## 2015-09-12 DIAGNOSIS — H109 Unspecified conjunctivitis: Secondary | ICD-10-CM | POA: Diagnosis not present

## 2015-09-12 DIAGNOSIS — J019 Acute sinusitis, unspecified: Secondary | ICD-10-CM

## 2015-09-12 DIAGNOSIS — I10 Essential (primary) hypertension: Secondary | ICD-10-CM | POA: Diagnosis not present

## 2015-09-12 MED ORDER — ERYTHROMYCIN 5 MG/GM OP OINT: 1.0000 "application " | TOPICAL_OINTMENT | Freq: Four times a day (QID) | OPHTHALMIC | Status: DC

## 2015-09-12 MED ORDER — TRIAMTERENE-HCTZ 37.5-25 MG PO TABS: 1.0000 | ORAL_TABLET | Freq: Every day | ORAL | Status: DC

## 2015-09-12 MED ORDER — AMOXICILLIN 500 MG PO CAPS: 500.0000 mg | ORAL_CAPSULE | Freq: Three times a day (TID) | ORAL | Status: DC

## 2015-09-12 NOTE — Progress Notes (Signed)
   Subjective:    Patient ID: Cynthia Hendrix, female    DOB: Apr 02, 1961, 55 y.o.   MRN: [id number]  HPI The last 4-5 days she's had sinus discomfort with head congestion and green or brown nasal discharge. Occasionally she'll note blood (epistaxis) as well.   She's had redness in the left eye and wakens with crusting in that eye in the mornings. The eye itches at night. She has some associated watering of the eyes well.  She ran out of her diuretic 2 weeks ago. Because she did not feel badly she was not concerned about refilling it.  Review of Systems Dental pain, sore throat , otic pain or otic discharge denied. No fever , chills or sweats. Extrinsic symptoms of sneezing or angioedema are denied. There is no significant cough, sputum production, wheezing,or  paroxysmal nocturnal dyspnea.     Objective:   Physical Exam  She has mild erythema of the conjunctiva of the left eye. There is no purulence. Extraocular motion and vision to confrontation are normal. She has an upper dental plate. Nares are markedly dry with some scabbing.  General appearance:Adequately nourished; no acute distress or increased work of breathing is present.    Lymphatic: No  lymphadenopathy about the head, neck, or axilla .  Eyes: No lid edema is present. There is no scleral icterus.  Ears:  External ear exam shows no significant lesions or deformities.  Otoscopic examination reveals clear canals, tympanic membranes are intact bilaterally without bulging, retraction, inflammation or discharge.  Nose:  External nasal examination shows no deformity or inflammation. No septal dislocation or deviation.No obstruction to airflow.   Oral exam:  lips and gums are healthy appearing.There is no oropharyngeal erythema or exudate .  Neck:  No deformities, thyromegaly, masses, or tenderness noted.   Supple with full range of motion without pain.   Heart:  Normal rate and regular rhythm. S1 and S2 normal without gallop,  murmur, click, rub or other extra sounds.   Lungs:Chest clear to auscultation; no wheezes, rhonchi,rales ,or rubs present.  Extremities:  No cyanosis, edema, or clubbing  noted    Skin: Warm & dry w/o tenting . No significant lesions or rash.     Assessment & Plan:  #1 rhinosinusitis without significant bronchitis #2 conjunctivitis Plan: Nasal hygiene interventions discussed. See prescription medications

## 2015-09-12 NOTE — Patient Instructions (Signed)
Plain Mucinex (NOT D) for thick secretions ;force NON dairy fluids .   Nasal cleansing in the shower as discussed with lather of mild shampoo.After 10 seconds wash off lather while  exhaling through nostrils. Make sure that all residual soap is removed to prevent irritation.  Flonase OR Nasacort AQ 1 spray in each nostril twice a day as needed. Use the "crossover" technique into opposite nostril spraying toward opposite ear @ 45 degree angle, not straight up into nostril.  Plain Allegra (NOT D )  160 daily , Loratidine 10 mg , OR Zyrtec 10 mg @ bedtime  as needed for itchy eyes & sneezing.  Minimal Blood Pressure Goal= AVERAGE < 140/90;  Ideal is an AVERAGE < 135/85. This AVERAGE should be calculated from @ least 5-7 BP readings taken @ different times of day on different days of week. You should not respond to isolated BP readings , but rather the AVERAGE for that week .Please bring your  blood pressure cuff to office visits to verify that it is reliable.It  can also be checked against the blood pressure device at the pharmacy. Finger or wrist cuffs are not dependable; an arm cuff is. 

## 2015-09-12 NOTE — Progress Notes (Signed)
Pre visit review using our clinic review tool, if applicable. No additional management support is needed unless otherwise documented below in the visit note. 

## 2015-11-11 ENCOUNTER — Ambulatory Visit: Payer: 59 | Admitting: Obstetrics

## 2015-11-12 ENCOUNTER — Ambulatory Visit: Payer: 59 | Admitting: Obstetrics

## 2016-02-04 ENCOUNTER — Other Ambulatory Visit: Payer: Self-pay | Admitting: *Deleted

## 2016-02-04 DIAGNOSIS — I10 Essential (primary) hypertension: Secondary | ICD-10-CM

## 2017-01-12 ENCOUNTER — Encounter: Payer: Self-pay | Admitting: Nurse Practitioner

## 2017-01-12 ENCOUNTER — Ambulatory Visit: Payer: 59 | Admitting: Internal Medicine

## 2017-01-12 ENCOUNTER — Other Ambulatory Visit (INDEPENDENT_AMBULATORY_CARE_PROVIDER_SITE_OTHER): Payer: 59

## 2017-01-12 ENCOUNTER — Ambulatory Visit (INDEPENDENT_AMBULATORY_CARE_PROVIDER_SITE_OTHER): Payer: 59 | Admitting: Nurse Practitioner

## 2017-01-12 VITALS — BP 280/150 | HR 100 | Temp 98.4°F | Ht 69.0 in | Wt 154.0 lb

## 2017-01-12 DIAGNOSIS — I10 Essential (primary) hypertension: Secondary | ICD-10-CM | POA: Diagnosis not present

## 2017-01-12 DIAGNOSIS — F418 Other specified anxiety disorders: Secondary | ICD-10-CM

## 2017-01-12 LAB — BASIC METABOLIC PANEL
BUN: 11 mg/dL (ref 6–23)
CALCIUM: 9.5 mg/dL (ref 8.4–10.5)
CO2: 32 mEq/L (ref 19–32)
Chloride: 104 mEq/L (ref 96–112)
Creatinine, Ser: 0.83 mg/dL (ref 0.40–1.20)
GFR: 92.1 mL/min (ref >60.00)
GLUCOSE: 90 mg/dL (ref 70–99)
Potassium: 3.5 mEq/L (ref 3.5–5.1)
Sodium: 143 mEq/L (ref 135–145)

## 2017-01-12 MED ORDER — PAROXETINE HCL 20 MG PO TABS: 20.0000 mg | ORAL_TABLET | Freq: Every day | ORAL | 2 refills | Status: AC

## 2017-01-12 MED ORDER — TRIAMTERENE-HCTZ 37.5-25 MG PO TABS: 1.0000 | ORAL_TABLET | Freq: Every day | ORAL | 2 refills | Status: AC

## 2017-01-12 MED ORDER — IRBESARTAN 300 MG PO TABS: 300.0000 mg | ORAL_TABLET | Freq: Every day | ORAL | 2 refills | Status: DC

## 2017-01-12 MED ORDER — LABETALOL HCL 300 MG PO TABS: 300.0000 mg | ORAL_TABLET | Freq: Two times a day (BID) | ORAL | 2 refills | Status: AC

## 2017-01-12 NOTE — Patient Instructions (Addendum)
I instructed Cynthia Hendrix to start 1/2 tablet once daily for 1 week and then increase to a full tablet once daily on week two as tolerated.   We discussed common side effects such as nausea, drowsiness and weight gain.  Also discussed rare but serious side effect of suicide ideation.  She is instructed to discontinue medication go directly to ED if this occurs.  Cynthia Hendrix verbalizes understanding.   Plan follow up in 1 month to evaluate progress.     Hypertension Hypertension is another name for high blood pressure. High blood pressure forces your heart to work harder to pump blood. This can cause problems over time. There are two numbers in a blood pressure reading. There is a top number (systolic) over a bottom number (diastolic). It is best to have a blood pressure below 120/80. Healthy choices can help lower your blood pressure. You may need medicine to help lower your blood pressure if:  Your blood pressure cannot be lowered with healthy choices.  Your blood pressure is higher than 130/80.  Follow these instructions at home: Eating and drinking  If directed, follow the DASH eating plan. This diet includes: ? Filling half of your plate at each meal with fruits and vegetables. ? Filling one quarter of your plate at each meal with whole grains. Whole grains include whole wheat pasta, brown rice, and whole grain bread. ? Eating or drinking low-fat dairy products, such as skim milk or low-fat yogurt. ? Filling one quarter of your plate at each meal with low-fat (lean) proteins. Low-fat proteins include fish, skinless chicken, eggs, beans, and tofu. ? Avoiding fatty meat, cured and processed meat, or chicken with skin. ? Avoiding premade or processed food.  Eat less than 1,500 mg of salt (sodium) a day.  Limit alcohol use to no more than 1 drink a day for nonpregnant women and 2 drinks a day for men. One drink equals 12 oz of beer, 5 oz of wine, or 1 oz of hard liquor. Lifestyle  Work with your doctor to  stay at a healthy weight or to lose weight. Ask your doctor what the best weight is for you.  Get at least 30 minutes of exercise that causes your heart to beat faster (aerobic exercise) most days of the week. This may include walking, swimming, or biking.  Get at least 30 minutes of exercise that strengthens your muscles (resistance exercise) at least 3 days a week. This may include lifting weights or pilates.  Do not use any products that contain nicotine or tobacco. This includes cigarettes and e-cigarettes. If you need help quitting, ask your doctor.  Check your blood pressure at home as told by your doctor.  Keep all follow-up visits as told by your doctor. This is important. Medicines  Take over-the-counter and prescription medicines only as told by your doctor. Follow directions carefully.  Do not skip doses of blood pressure medicine. The medicine does not work as well if you skip doses. Skipping doses also puts you at risk for problems.  Ask your doctor about side effects or reactions to medicines that you should watch for. Contact a doctor if:  You think you are having a reaction to the medicine you are taking.  You have headaches that keep coming back (recurring).  You feel dizzy.  You have swelling in your ankles.  You have trouble with your vision. Get help right away if:  You get a very bad headache.  You start to feel confused.  You  feel weak or numb.  You feel faint.  You get very bad pain in your: ? Chest. ? Belly (abdomen).  You throw up (vomit) more than once.  You have trouble breathing. Summary  Hypertension is another name for high blood pressure.  Making healthy choices can help lower blood pressure. If your blood pressure cannot be controlled with healthy choices, you may need to take medicine. This information is not intended to replace advice given to you by your health care provider. Make sure you discuss any questions you have with your  health care provider. Document Released: 12/30/2007 Document Revised: 06/10/2016 Document Reviewed: 06/10/2016 Elsevier Interactive Patient Education  2018 [employer].   DASH Eating Plan DASH stands for "Dietary Approaches to Stop Hypertension." The DASH eating plan is a healthy eating plan that has been shown to reduce high blood pressure (hypertension). It may also reduce your risk for type 2 diabetes, heart disease, and stroke. The DASH eating plan may also help with weight loss. What are tips for following this plan? General guidelines  Avoid eating more than 2,300 mg (milligrams) of salt (sodium) a day. If you have hypertension, you may need to reduce your sodium intake to 1,500 mg a day.  Limit alcohol intake to no more than 1 drink a day for nonpregnant women and 2 drinks a day for men. One drink equals 12 oz of beer, 5 oz of wine, or 1 oz of hard liquor.  Work with your health care provider to maintain a healthy body weight or to lose weight. Ask what an ideal weight is for you.  Get at least 30 minutes of exercise that causes your heart to beat faster (aerobic exercise) most days of the week. Activities may include walking, swimming, or biking.  Work with your health care provider or diet and nutrition specialist (dietitian) to adjust your eating plan to your individual calorie needs. Reading food labels  Check food labels for the amount of sodium per serving. Choose foods with less than 5 percent of the Daily Value of sodium. Generally, foods with less than 300 mg of sodium per serving fit into this eating plan.  To find whole grains, look for the word "whole" as the first word in the ingredient list. Shopping  Buy products labeled as "low-sodium" or "no salt added."  Buy fresh foods. Avoid canned foods and premade or frozen meals. Cooking  Avoid adding salt when cooking. Use salt-free seasonings or herbs instead of table salt or sea salt. Check with your health care  provider or pharmacist before using salt substitutes.  Do not fry foods. Cook foods using healthy methods such as baking, boiling, grilling, and broiling instead.  Cook with heart-healthy oils, such as olive, canola, soybean, or sunflower oil. Meal planning   Eat a balanced diet that includes: ? 5 or more servings of fruits and vegetables each day. At each meal, try to fill half of your plate with fruits and vegetables. ? Up to 6-8 servings of whole grains each day. ? Less than 6 oz of lean meat, poultry, or fish each day. A 3-oz serving of meat is about the same size as a deck of cards. One egg equals 1 oz. ? 2 servings of low-fat dairy each day. ? A serving of nuts, seeds, or beans 5 times each week. ? Heart-healthy fats. Healthy fats called Omega-3 fatty acids are found in foods such as flaxseeds and coldwater fish, like sardines, salmon, and mackerel.  Limit how much  you eat of the following: ? Canned or prepackaged foods. ? Food that is high in trans fat, such as fried foods. ? Food that is high in saturated fat, such as fatty meat. ? Sweets, desserts, sugary drinks, and other foods with added sugar. ? Full-fat dairy products.  Do not salt foods before eating.  Try to eat at least 2 vegetarian meals each week.  Eat more home-cooked food and less restaurant, buffet, and fast food.  When eating at a restaurant, ask that your food be prepared with less salt or no salt, if possible. What foods are recommended? The items listed may not be a complete list. Talk with your dietitian about what dietary choices are best for you. Grains Whole-grain or whole-wheat bread. Whole-grain or whole-wheat pasta. Brown rice. Orpah Cobbatmeal. Quinoa. Bulgur. Whole-grain and low-sodium cereals. Pita bread. Low-fat, low-sodium crackers. Whole-wheat flour tortillas. Vegetables Fresh or frozen vegetables (raw, steamed, roasted, or grilled). Low-sodium or reduced-sodium tomato and vegetable juice. Low-sodium or  reduced-sodium tomato sauce and tomato paste. Low-sodium or reduced-sodium canned vegetables. Fruits All fresh, dried, or frozen fruit. Canned fruit in natural juice (without added sugar). Meat and other protein foods Skinless chicken or Mal[place]urkey. Ground chicken or [place]urkey. Pork with fat trimmed off. Fish and seafood. Egg whites. Dried beans, peas, or lentils. Unsalted nuts, nut butters, and seeds. Unsalted canned beans. Lean cuts of beef with fat trimmed off. Low-sodium, lean deli meat. Dairy Low-fat (1%) or fat-free (skim) milk. Fat-free, low-fat, or reduced-fat cheeses. Nonfat, low-sodium ricotta or cottage cheese. Low-fat or nonfat yogurt. Low-fat, low-sodium cheese. Fats and oils Soft margarine without trans fats. Vegetable oil. Low-fat, reduced-fat, or light mayonnaise and salad dressings (reduced-sodium). Canola, safflower, olive, soybean, and sunflower oils. Avocado. Seasoning and other foods Herbs. Spices. Seasoning mixes without salt. Unsalted popcorn and pretzels. Fat-free sweets. What foods are not recommended? The items listed may not be a complete list. Talk with your dietitian about what dietary choices are best for you. Grains Baked goods made with fat, such as croissants, muffins, or some breads. Dry pasta or rice meal packs. Vegetables Creamed or fried vegetables. Vegetables in a cheese sauce. Regular canned vegetables (not low-sodium or reduced-sodium). Regular canned tomato sauce and paste (not low-sodium or reduced-sodium). Regular tomato and vegetable juice (not low-sodium or reduced-sodium). Rosita FirePickles. Olives. Fruits Canned fruit in a light or heavy syrup. Fried fruit. Fruit in cream or butter sauce. Meat and other protein foods Fatty cuts of meat. Ribs. Fried meat. Tomasa BlaseBacon. Sausage. Bologna and other processed lunch meats. Salami. Fatback. Hotdogs. Bratwurst. Salted nuts and seeds. Canned beans with added salt. Canned or smoked fish. Whole eggs or egg yolks. Chicken or Malaw[place]urkey  with skin. Dairy Whole or 2% milk, cream, and half-and-half. Whole or full-fat cream cheese. Whole-fat or sweetened yogurt. Full-fat cheese. Nondairy creamers. Whipped toppings. Processed cheese and cheese spreads. Fats and oils Butter. Stick margarine. Lard. Shortening. Ghee. Bacon fat. Tropical oils, such as coconut, palm kernel, or palm oil. Seasoning and other foods Salted popcorn and pretzels. Onion salt, garlic salt, seasoned salt, table salt, and sea salt. Worcestershire sauce. Tartar sauce. Barbecue sauce. Teriyaki sauce. Soy sauce, including reduced-sodium. Steak sauce. Canned and packaged gravies. Fish sauce. Oyster sauce. Cocktail sauce. Horseradish that you find on the shelf. Ketchup. Mustard. Meat flavorings and tenderizers. Bouillon cubes. Hot sauce and Tabasco sauce. Premade or packaged marinades. Premade or packaged taco seasonings. Relishes. Regular salad dressings. Where to find more information:  National Heart, Lung, and Blood Institute: PopSteam.iswww.nhlbi.nih.gov  American Heart Association: www.heart.org Summary  The DASH eating plan is a healthy eating plan that has been shown to reduce high blood pressure (hypertension). It may also reduce your risk for type 2 diabetes, heart disease, and stroke.  With the DASH eating plan, you should limit salt (sodium) intake to 2,300 mg a day. If you have hypertension, you may need to reduce your sodium intake to 1,500 mg a day.  When on the DASH eating plan, aim to eat more fresh fruits and vegetables, whole grains, lean proteins, low-fat dairy, and heart-healthy fats.  Work with your health care provider or diet and nutrition specialist (dietitian) to adjust your eating plan to your individual calorie needs. This information is not intended to replace advice given to you by your health care provider. Make sure you discuss any questions you have with your health care provider. Document Released: 07/02/2011 Document Revised: 07/06/2016  Document Reviewed: 07/06/2016 Elsevier Interactive Patient Education  2017 [employer].

## 2017-01-12 NOTE — Progress Notes (Addendum)
Subjective:  Patient ID: Cynthia Hendrix, female    DOB: [birth year]  Age: 56 y.o. MRN: [id number]  CC: Insomnia (cant sleep,very weak,stressful times at work, night sweat going 1 mo/ have not take her BP med for couple wks)  HPI  HTN: Uncontrolled. Out of medications for 4weeks.  Anxiety: Related to job stress. Stopped taking paxil because she was feeling better.  Outpatient Medications Prior to Visit  Medication Sig Dispense Refill  . irbesartan (AVAPRO) 300 MG tablet Take 1 tablet (300 mg total) by mouth daily. 90 tablet 3  . labetalol (NORMODYNE) 300 MG tablet Take 1 tablet (300 mg total) by mouth 2 (two) times daily. 180 tablet 3  . triamterene-hydrochlorothiazide (MAXZIDE-25) 37.5-25 MG tablet Take 1 tablet by mouth daily. 30 tablet 5  . amoxicillin (AMOXIL) 500 MG capsule Take 1 capsule (500 mg total) by mouth 3 (three) times daily. (Patient not taking: Reported on 01/12/2017) 30 capsule 0  . erythromycin ophthalmic ointment Place 1 application into the left eye 4 (four) times daily. (Patient not taking: Reported on 01/12/2017) 3.5 g 0   No facility-administered medications prior to visit.     ROS Review of Systems  Constitutional: Positive for malaise/fatigue.  Respiratory: Negative for shortness of breath.   Cardiovascular: Negative for chest pain, palpitations and leg swelling.  Musculoskeletal: Negative for falls.  Skin: Negative.   Neurological: Negative for dizziness, weakness and headaches.  Psychiatric/Behavioral: Positive for depression. Negative for substance abuse and suicidal ideas. The patient is nervous/anxious and has insomnia.     Objective:  BP (!) 280/150   Pulse 100   Temp 98.4 F (36.9 C)   Ht 5\' 9"  (1.753 m)   Wt 154 lb (69.9 kg)   SpO2 99%   BMI 22.74 kg/m   BP Readings from Last 3 Encounters:  01/12/17 (!) 280/150  09/13/15 (!) 150/100  03/06/15 (!) 230/138    Wt Readings from Last 3 Encounters:  01/12/17 154 lb (69.9 kg)  09/13/15  180 lb 1.3 oz (81.7 kg)  03/06/15 193 lb (87.5 kg)    Physical Exam  Constitutional: She is oriented to person, place, and time. No distress.  HENT:  Right Ear: External ear normal.  Left Ear: External ear normal.  Nose: Nose normal.  Eyes: No scleral icterus.  Neck: Normal range of motion. Neck supple.  Cardiovascular: Normal rate, regular rhythm and normal heart sounds.   Pulmonary/Chest: Effort normal and breath sounds normal. No respiratory distress.  Musculoskeletal: Normal range of motion. She exhibits no edema.  Neurological: She is alert and oriented to person, place, and time.  Skin: Skin is warm and dry.  Psychiatric:  Tearful during office visit.    Lab Results  Component Value Date   WBC 5.9 07/24/2014   HGB 13.3 07/24/2014   HCT 40.9 07/24/2014   PLT 289.0 07/24/2014   GLUCOSE 90 01/12/2017   CHOL 172 08/07/2011   TRIG 123.0 08/07/2011   HDL 39.80 08/07/2011   LDLCALC 108 (H) 08/07/2011   ALT 11 11/15/2014   AST 14 11/15/2014   NA 143 01/12/2017   K 3.5 01/12/2017   CL 104 01/12/2017   CREATININE 0.83 01/12/2017   BUN 11 01/12/2017   CO2 32 01/12/2017   TSH 2.67 03/06/2015   INR 0.92 06/23/2014    No results found.  Assessment & Plan:   Cynthia Hendrix was seen today for insomnia.  Diagnoses and all orders for this visit:  Malignant hypertension -  irbesartan (AVAPRO) 300 MG tablet; Take 1 tablet (300 mg total) by mouth daily. -     Basic metabolic panel; Future -     labetalol (NORMODYNE) 300 MG tablet; Take 1 tablet (300 mg total) by mouth 2 (two) times daily. -     triamterene-hydrochlorothiazide (MAXZIDE-25) 37.5-25 MG tablet; Take 1 tablet by mouth daily.  Depression with anxiety -     PARoxetine (PAXIL) 20 MG tablet; Take 1 tablet (20 mg total) by mouth daily.  Essential hypertension -     irbesartan (AVAPRO) 300 MG tablet; Take 1 tablet (300 mg total) by mouth daily.   I have discontinued Cynthia Hendrix's amoxicillin and erythromycin. I am  also having her start on PARoxetine. Additionally, I am having her maintain her irbesartan, labetalol, and triamterene-hydrochlorothiazide.  Meds ordered this encounter  Medications  . PARoxetine (PAXIL) 20 MG tablet    Sig: Take 1 tablet (20 mg total) by mouth daily.    Dispense:  30 tablet    Refill:  2    Order Specific Question:   Supervising Provider    Answer:   Tresa GarterPLOTNIKOV, ALEKSEI V [[id number]]  . irbesartan (AVAPRO) 300 MG tablet    Sig: Take 1 tablet (300 mg total) by mouth daily.    Dispense:  30 tablet    Refill:  2    Order Specific Question:   Supervising Provider    Answer:   Tresa GarterPLOTNIKOV, ALEKSEI V [[id number]]  . labetalol (NORMODYNE) 300 MG tablet    Sig: Take 1 tablet (300 mg total) by mouth 2 (two) times daily.    Dispense:  60 tablet    Refill:  2    Order Specific Question:   Supervising Provider    Answer:   Tresa GarterPLOTNIKOV, ALEKSEI V [[id number]]  . triamterene-hydrochlorothiazide (MAXZIDE-25) 37.5-25 MG tablet    Sig: Take 1 tablet by mouth daily.    Dispense:  30 tablet    Refill:  2    Order Specific Question:   Supervising Provider    Answer:   Tresa GarterPLOTNIKOV, ALEKSEI V [[id number]]    Follow-up: Return in about 6 days (around 01/18/2017) for HTN.  Cynthia Pennaharlotte Cicely Ortner, NP

## 2017-01-12 NOTE — Addendum Note (Signed)
Addended by: Alysia PennaNCHE, Cayden Granholm L on: 01/12/2017 05:20 PM   Modules accepted: Orders

## 2017-01-18 ENCOUNTER — Ambulatory Visit (INDEPENDENT_AMBULATORY_CARE_PROVIDER_SITE_OTHER): Payer: 59 | Admitting: Nurse Practitioner

## 2017-01-18 ENCOUNTER — Encounter: Payer: Self-pay | Admitting: Nurse Practitioner

## 2017-01-18 VITALS — BP 246/152 | HR 84 | Temp 98.8°F | Ht 69.0 in | Wt 158.0 lb

## 2017-01-18 DIAGNOSIS — G4700 Insomnia, unspecified: Secondary | ICD-10-CM | POA: Diagnosis not present

## 2017-01-18 DIAGNOSIS — I10 Essential (primary) hypertension: Secondary | ICD-10-CM

## 2017-01-18 DIAGNOSIS — F418 Other specified anxiety disorders: Secondary | ICD-10-CM | POA: Diagnosis not present

## 2017-01-18 MED ORDER — HYDRALAZINE HCL 50 MG PO TABS: 50.0000 mg | ORAL_TABLET | Freq: Three times a day (TID) | ORAL | 2 refills | Status: AC

## 2017-01-18 MED ORDER — TEMAZEPAM 7.5 MG PO CAPS: 7.5000 mg | ORAL_CAPSULE | Freq: Every evening | ORAL | 0 refills | Status: DC | PRN

## 2017-01-18 NOTE — Assessment & Plan Note (Signed)
Possible noncompliance with medications? No improvement with avapro, labetalol, and maxizide. Added hydralazine today. Refer to cardiology.

## 2017-01-18 NOTE — Progress Notes (Signed)
Subjective:  Patient ID: Cynthia Hendrix, female    DOB: [birth year]  Age: 56 y.o. MRN: [id number]  CC: Follow-up (1 wk fu/had loss stool last night--stay up--not sure if it is from med?/ goes sleep but doesnt stay as sleep--took BP at home yesterday 194/102)   HPI  HTN: Uncontrolled. States she is taking medication as prescribed. Daily tobacco use.  Anxiety and Depression: Some improvement with paxil (1/2tab twice a day). Reports improved mood. She has scheduled appt with Employee Assistance program to get psychotherapy. Developed increased stool frequency (soft, no blood, no fever, no ABD pain, no nausea/vomiting), and wondering if related to paxil?  Outpatient Medications Prior to Visit  Medication Sig Dispense Refill  . irbesartan (AVAPRO) 300 MG tablet Take 1 tablet (300 mg total) by mouth daily. 30 tablet 2  . labetalol (NORMODYNE) 300 MG tablet Take 1 tablet (300 mg total) by mouth 2 (two) times daily. 60 tablet 2  . PARoxetine (PAXIL) 20 MG tablet Take 1 tablet (20 mg total) by mouth daily. 30 tablet 2  . triamterene-hydrochlorothiazide (MAXZIDE-25) 37.5-25 MG tablet Take 1 tablet by mouth daily. 30 tablet 2   No facility-administered medications prior to visit.     ROS Review of Systems  Constitutional: Negative for malaise/fatigue.  Eyes: Negative for blurred vision.  Respiratory: Negative for shortness of breath.   Cardiovascular: Negative for chest pain, palpitations, leg swelling and PND.  Skin: Negative.   Neurological: Negative for dizziness, sensory change, speech change, loss of consciousness, weakness and headaches.  Psychiatric/Behavioral: Positive for depression. Negative for hallucinations, memory loss and suicidal ideas. The patient is nervous/anxious and has insomnia.     Objective:  BP (!) 246/152   Pulse 84   Temp 98.8 F (37.1 C)   Ht 5\' 9"  (1.753 m)   Wt 158 lb (71.7 kg)   SpO2 98%   BMI 23.33 kg/m   BP Readings from Last 3 Encounters:    01/18/17 (!) 246/152  01/12/17 (!) 280/150  09/13/15 (!) 150/100    Wt Readings from Last 3 Encounters:  01/18/17 158 lb (71.7 kg)  01/12/17 154 lb (69.9 kg)  09/13/15 180 lb 1.3 oz (81.7 kg)    Physical Exam  Constitutional: She is oriented to person, place, and time. No distress.  Neck: Normal range of motion. Neck supple. No thyromegaly present.  Cardiovascular: Normal rate, regular rhythm and normal heart sounds.   Pulmonary/Chest: Effort normal and breath sounds normal.  Musculoskeletal: She exhibits no edema.  Lymphadenopathy:    She has no cervical adenopathy.  Neurological: She is alert and oriented to person, place, and time.  Skin: Skin is warm and dry.  Vitals reviewed.   Lab Results  Component Value Date   WBC 5.9 07/24/2014   HGB 13.3 07/24/2014   HCT 40.9 07/24/2014   PLT 289.0 07/24/2014   GLUCOSE 90 01/12/2017   CHOL 172 08/07/2011   TRIG 123.0 08/07/2011   HDL 39.80 08/07/2011   LDLCALC 108 (H) 08/07/2011   ALT 11 11/15/2014   AST 14 11/15/2014   NA 143 01/12/2017   K 3.5 01/12/2017   CL 104 01/12/2017   CREATININE 0.83 01/12/2017   BUN 11 01/12/2017   CO2 32 01/12/2017   TSH 2.67 03/06/2015   INR 0.92 06/23/2014    No results found.  Assessment & Plan:   Cynthia Hendrix was seen today for follow-up.  Diagnoses and all orders for this visit:  Depression with anxiety -  temazepam (RESTORIL) 7.5 MG capsule; Take 1 capsule (7.5 mg total) by mouth at bedtime as needed for sleep.  Malignant hypertension -     hydrALAZINE (APRESOLINE) 50 MG tablet; Take 1 tablet (50 mg total) by mouth 3 (three) times daily. -     Ambulatory referral to Cardiology   I am having Cynthia Hendrix start on temazepam and hydrALAZINE. I am also having Cynthia Hendrix maintain Cynthia Hendrix PARoxetine, irbesartan, labetalol, and triamterene-hydrochlorothiazide.  Meds ordered this encounter  Medications  . temazepam (RESTORIL) 7.5 MG capsule    Sig: Take 1 capsule (7.5 mg total) by mouth at  bedtime as needed for sleep.    Dispense:  30 capsule    Refill:  0    Order Specific Question:   Supervising Provider    Answer:   Tresa GarterPLOTNIKOV, ALEKSEI V [[id number]]  . hydrALAZINE (APRESOLINE) 50 MG tablet    Sig: Take 1 tablet (50 mg total) by mouth 3 (three) times daily.    Dispense:  90 tablet    Refill:  2    Order Specific Question:   Supervising Provider    Answer:   Tresa GarterPLOTNIKOV, ALEKSEI V [[id number]]    Follow-up: Return in about 2 weeks (around 02/01/2017) for HTN and anxiety.  Alysia Pennaharlotte [place], NP

## 2017-01-18 NOTE — Patient Instructions (Addendum)
Continue current medications. Added hydralazine for HTN.  Continue to monitor BP once a day and record.  I instructed pt to start 1/2 tablet once daily for 1 week and then increase to a full tablet once daily on week two as tolerated.   We discussed common side effects such as nausea, drowsiness and weight gain.  Also discussed rare but serious side effect of suicide ideation.  She is instructed to discontinue medication go directly to ED if this occurs.  Pt verbalizes understanding.   Plan follow up in 1 month to evaluate progress.     Managing Your Hypertension Hypertension is commonly called high blood pressure. This is when the force of your blood pressing against the walls of your arteries is too strong. Arteries are blood vessels that carry blood from your heart throughout your body. Hypertension forces the heart to work harder to pump blood, and may cause the arteries to become narrow or stiff. Having untreated or uncontrolled hypertension can cause heart attack, stroke, kidney disease, and other problems. What are blood pressure readings? A blood pressure reading consists of a higher number over a lower number. Ideally, your blood pressure should be below 120/80. The first ("top") number is called the systolic pressure. It is a measure of the pressure in your arteries as your heart beats. The second ("bottom") number is called the diastolic pressure. It is a measure of the pressure in your arteries as the heart relaxes. What does my blood pressure reading mean? Blood pressure is classified into four stages. Based on your blood pressure reading, your health care provider may use the following stages to determine what type of treatment you need, if any. Systolic pressure and diastolic pressure are measured in a unit called mm Hg. Normal  Systolic pressure: below 120.  Diastolic pressure: below 80. Elevated  Systolic pressure: 120-129.  Diastolic pressure: below 80. Hypertension stage  1  Systolic pressure: 130-139.  Diastolic pressure: 80-89. Hypertension stage 2  Systolic pressure: 140 or above.  Diastolic pressure: 90 or above. What health risks are associated with hypertension? Managing your hypertension is an important responsibility. Uncontrolled hypertension can lead to:  A heart attack.  A stroke.  A weakened blood vessel (aneurysm).  Heart failure.  Kidney damage.  Eye damage.  Metabolic syndrome.  Memory and concentration problems.  What changes can I make to manage my hypertension? Hypertension can be managed by making lifestyle changes and possibly by taking medicines. Your health care provider will help you make a plan to bring your blood pressure within a normal range. Eating and drinking  Eat a diet that is high in fiber and potassium, and low in salt (sodium), added sugar, and fat. An example eating plan is called the DASH (Dietary Approaches to Stop Hypertension) diet. To eat this way: ? Eat plenty of fresh fruits and vegetables. Try to fill half of your plate at each meal with fruits and vegetables. ? Eat whole grains, such as whole wheat pasta, brown rice, or whole grain bread. Fill about one quarter of your plate with whole grains. ? Eat low-fat diary products. ? Avoid fatty cuts of meat, processed or cured meats, and poultry with skin. Fill about one quarter of your plate with lean proteins such as fish, chicken without skin, beans, eggs, and tofu. ? Avoid premade and processed foods. These tend to be higher in sodium, added sugar, and fat.  Reduce your daily sodium intake. Most people with hypertension should eat less than 1,500  mg of sodium a day.  Limit alcohol intake to no more than 1 drink a day for nonpregnant women and 2 drinks a day for men. One drink equals 12 oz of beer, 5 oz of wine, or 1 oz of hard liquor. Lifestyle  Work with your health care provider to maintain a healthy body weight, or to lose weight. Ask what an  ideal weight is for you.  Get at least 30 minutes of exercise that causes your heart to beat faster (aerobic exercise) most days of the week. Activities may include walking, swimming, or biking.  Include exercise to strengthen your muscles (resistance exercise), such as weight lifting, as part of your weekly exercise routine. Try to do these types of exercises for 30 minutes at least 3 days a week.  Do not use any products that contain nicotine or tobacco, such as cigarettes and e-cigarettes. If you need help quitting, ask your health care provider.  Control any long-term (chronic) conditions you have, such as high cholesterol or diabetes. Monitoring  Monitor your blood pressure at home as told by your health care provider. Your personal target blood pressure may vary depending on your medical conditions, your age, and other factors.  Have your blood pressure checked regularly, as often as told by your health care provider. Working with your health care provider  Review all the medicines you take with your health care provider because there may be side effects or interactions.  Talk with your health care provider about your diet, exercise habits, and other lifestyle factors that may be contributing to hypertension.  Visit your health care provider regularly. Your health care provider can help you create and adjust your plan for managing hypertension. Will I need medicine to control my blood pressure? Your health care provider may prescribe medicine if lifestyle changes are not enough to get your blood pressure under control, and if:  Your systolic blood pressure is 130 or higher.  Your diastolic blood pressure is 80 or higher.  Take medicines only as told by your health care provider. Follow the directions carefully. Blood pressure medicines must be taken as prescribed. The medicine does not work as well when you skip doses. Skipping doses also puts you at risk for problems. Contact a  health care provider if:  You think you are having a reaction to medicines you have taken.  You have repeated (recurrent) headaches.  You feel dizzy.  You have swelling in your ankles.  You have trouble with your vision. Get help right away if:  You develop a severe headache or confusion.  You have unusual weakness or numbness, or you feel faint.  You have severe pain in your chest or abdomen.  You vomit repeatedly.  You have trouble breathing. Summary  Hypertension is when the force of blood pumping through your arteries is too strong. If this condition is not controlled, it may put you at risk for serious complications.  Your personal target blood pressure may vary depending on your medical conditions, your age, and other factors. For most people, a normal blood pressure is less than 120/80.  Hypertension is managed by lifestyle changes, medicines, or both. Lifestyle changes include weight loss, eating a healthy, low-sodium diet, exercising more, and limiting alcohol. This information is not intended to replace advice given to you by your health care provider. Make sure you discuss any questions you have with your health care provider. Document Released: 04/06/2012 Document Revised: 06/10/2016 Document Reviewed: 06/10/2016 Elsevier Interactive Patient Education  2018 Else[employer] Inc.  

## 2017-01-21 ENCOUNTER — Encounter: Payer: Self-pay | Admitting: Nurse Practitioner

## 2017-01-21 ENCOUNTER — Ambulatory Visit (INDEPENDENT_AMBULATORY_CARE_PROVIDER_SITE_OTHER): Payer: 59 | Admitting: Nurse Practitioner

## 2017-01-21 VITALS — BP 240/142 | HR 85 | Temp 98.3°F | Ht 69.0 in | Wt 153.0 lb

## 2017-01-21 DIAGNOSIS — I10 Essential (primary) hypertension: Secondary | ICD-10-CM

## 2017-01-21 MED ORDER — SPIRONOLACTONE 50 MG PO TABS: 50.0000 mg | ORAL_TABLET | Freq: Every day | ORAL | 1 refills | Status: AC

## 2017-01-21 NOTE — Patient Instructions (Addendum)
Managing Your Hypertension Hypertension is commonly called high blood pressure. This is when the force of your blood pressing against the walls of your arteries is too strong. Arteries are blood vessels that carry blood from your heart throughout your body. Hypertension forces the heart to work harder to pump blood, and may cause the arteries to become narrow or stiff. Having untreated or uncontrolled hypertension can cause heart attack, stroke, kidney disease, and other problems. What are blood pressure readings? A blood pressure reading consists of a higher number over a lower number. Ideally, your blood pressure should be below 120/80. The first ("top") number is called the systolic pressure. It is a measure of the pressure in your arteries as your heart beats. The second ("bottom") number is called the diastolic pressure. It is a measure of the pressure in your arteries as the heart relaxes. What does my blood pressure reading mean? Blood pressure is classified into four stages. Based on your blood pressure reading, your health care provider may use the following stages to determine what type of treatment you need, if any. Systolic pressure and diastolic pressure are measured in a unit called mm Hg. Normal  Systolic pressure: below 120.  Diastolic pressure: below 80. Elevated  Systolic pressure: 120-129.  Diastolic pressure: below 80. Hypertension stage 1  Systolic pressure: 130-139.  Diastolic pressure: 80-89. Hypertension stage 2  Systolic pressure: 140 or above.  Diastolic pressure: 90 or above. What health risks are associated with hypertension? Managing your hypertension is an important responsibility. Uncontrolled hypertension can lead to:  A heart attack.  A stroke.  A weakened blood vessel (aneurysm).  Heart failure.  Kidney damage.  Eye damage.  Metabolic syndrome.  Memory and concentration problems.  What changes can I make to manage my  hypertension? Hypertension can be managed by making lifestyle changes and possibly by taking medicines. Your health care provider will help you make a plan to bring your blood pressure within a normal range. Eating and drinking  Eat a diet that is high in fiber and potassium, and low in salt (sodium), added sugar, and fat. An example eating plan is called the DASH (Dietary Approaches to Stop Hypertension) diet. To eat this way: ? Eat plenty of fresh fruits and vegetables. Try to fill half of your plate at each meal with fruits and vegetables. ? Eat whole grains, such as whole wheat pasta, brown rice, or whole grain bread. Fill about one quarter of your plate with whole grains. ? Eat low-fat diary products. ? Avoid fatty cuts of meat, processed or cured meats, and poultry with skin. Fill about one quarter of your plate with lean proteins such as fish, chicken without skin, beans, eggs, and tofu. ? Avoid premade and processed foods. These tend to be higher in sodium, added sugar, and fat.  Reduce your daily sodium intake. Most people with hypertension should eat less than 1,500 mg of sodium a day.  Limit alcohol intake to no more than 1 drink a day for nonpregnant women and 2 drinks a day for men. One drink equals 12 oz of beer, 5 oz of wine, or 1 oz of hard liquor. Lifestyle  Work with your health care provider to maintain a healthy body weight, or to lose weight. Ask what an ideal weight is for you.  Get at least 30 minutes of exercise that causes your heart to beat faster (aerobic exercise) most days of the week. Activities may include walking, swimming, or biking.  Include exercise   to strengthen your muscles (resistance exercise), such as weight lifting, as part of your weekly exercise routine. Try to do these types of exercises for 30 minutes at least 3 days a week.  Do not use any products that contain nicotine or tobacco, such as cigarettes and e-cigarettes. If you need help quitting, ask  your health care provider.  Control any long-term (chronic) conditions you have, such as high cholesterol or diabetes. Monitoring  Monitor your blood pressure at home as told by your health care provider. Your personal target blood pressure may vary depending on your medical conditions, your age, and other factors.  Have your blood pressure checked regularly, as often as told by your health care provider. Working with your health care provider  Review all the medicines you take with your health care provider because there may be side effects or interactions.  Talk with your health care provider about your diet, exercise habits, and other lifestyle factors that may be contributing to hypertension.  Visit your health care provider regularly. Your health care provider can help you create and adjust your plan for managing hypertension. Will I need medicine to control my blood pressure? Your health care provider may prescribe medicine if lifestyle changes are not enough to get your blood pressure under control, and if:  Your systolic blood pressure is 130 or higher.  Your diastolic blood pressure is 80 or higher.  Take medicines only as told by your health care provider. Follow the directions carefully. Blood pressure medicines must be taken as prescribed. The medicine does not work as well when you skip doses. Skipping doses also puts you at risk for problems. Contact a health care provider if:  You think you are having a reaction to medicines you have taken.  You have repeated (recurrent) headaches.  You feel dizzy.  You have swelling in your ankles.  You have trouble with your vision. Get help right away if:  You develop a severe headache or confusion.  You have unusual weakness or numbness, or you feel faint.  You have severe pain in your chest or abdomen.  You vomit repeatedly.  You have trouble breathing. Summary  Hypertension is when the force of blood pumping through  your arteries is too strong. If this condition is not controlled, it may put you at risk for serious complications.  Your personal target blood pressure may vary depending on your medical conditions, your age, and other factors. For most people, a normal blood pressure is less than 120/80.  Hypertension is managed by lifestyle changes, medicines, or both. Lifestyle changes include weight loss, eating a healthy, low-sodium diet, exercising more, and limiting alcohol. This information is not intended to replace advice given to you by your health care provider. Make sure you discuss any questions you have with your health care provider. Document Released: 04/06/2012 Document Revised: 06/10/2016 Document Reviewed: 06/10/2016 Elsevier Interactive Patient Education  2018 Elsevier Inc.  

## 2017-01-21 NOTE — Progress Notes (Signed)
Subjective:  Patient ID: Cynthia Hendrix, female    DOB: [birth year]  Age: 56 y.o. MRN: [id number]  CC: Follow-up (not feeling better--went to [facility] and was send home by HR--dizzy, threw up,nausea yesterday,--last night BP 180/102/)   HPI HTN: No improvement in BP with hydralazine, avapro, labetalol, and maxzide. States she is taking medications as prescribed. Has appt with cardiology 02/09/2017. Renal duplex done 01/2014: normal. Urine renin/aldosterone done 02/2015 was abnormal.  Insomnia: restoril caused caused nausea and dizziness.  Depression: Currently taking half tab of paxil per day.  Outpatient Medications Prior to Visit  Medication Sig Dispense Refill  . hydrALAZINE (APRESOLINE) 50 MG tablet Take 1 tablet (50 mg total) by mouth 3 (three) times daily. 90 tablet 2  . labetalol (NORMODYNE) 300 MG tablet Take 1 tablet (300 mg total) by mouth 2 (two) times daily. 60 tablet 2  . PARoxetine (PAXIL) 20 MG tablet Take 1 tablet (20 mg total) by mouth daily. 30 tablet 2  . triamterene-hydrochlorothiazide (MAXZIDE-25) 37.5-25 MG tablet Take 1 tablet by mouth daily. 30 tablet 2  . irbesartan (AVAPRO) 300 MG tablet Take 1 tablet (300 mg total) by mouth daily. 30 tablet 2  . temazepam (RESTORIL) 7.5 MG capsule Take 1 capsule (7.5 mg total) by mouth at bedtime as needed for sleep. 30 capsule 0   No facility-administered medications prior to visit.     ROS Review of Systems  Constitutional: Positive for malaise/fatigue.  Eyes: Negative for blurred vision.       Negative for visual changes  Respiratory: Negative for cough and shortness of breath.   Cardiovascular: Negative for chest pain, palpitations and leg swelling.  Gastrointestinal: Negative for abdominal pain, blood in stool, constipation, diarrhea, heartburn and nausea.  Musculoskeletal: Negative for falls, joint pain and myalgias.  Neurological: Negative for dizziness, sensory change and headaches.  Endo/Heme/Allergies: Does  not bruise/bleed easily.  Psychiatric/Behavioral: Positive for depression. Negative for hallucinations, substance abuse and suicidal ideas. The patient is nervous/anxious and has insomnia.      Objective:  BP (!) 240/142   Pulse 85   Temp 98.3 F (36.8 C)   Ht 5\' 9"  (1.753 m)   Wt 153 lb (69.4 kg)   SpO2 99%   BMI 22.59 kg/m   BP Readings from Last 3 Encounters:  01/21/17 (!) 240/142  01/18/17 (!) 246/152  01/12/17 (!) 280/150    Wt Readings from Last 3 Encounters:  01/21/17 153 lb (69.4 kg)  01/18/17 158 lb (71.7 kg)  01/12/17 154 lb (69.9 kg)    Physical Exam  Constitutional: She is oriented to person, place, and time. No distress.  Neck: Normal range of motion. Neck supple. No JVD present.  Cardiovascular: Normal rate, regular rhythm and normal heart sounds.   Pulmonary/Chest: Effort normal and breath sounds normal.  Musculoskeletal: She exhibits no edema.  Neurological: She is alert and oriented to person, place, and time.  Skin: Skin is warm and dry.  Vitals reviewed.   Lab Results  Component Value Date   WBC 5.9 07/24/2014   HGB 13.3 07/24/2014   HCT 40.9 07/24/2014   PLT 289.0 07/24/2014   GLUCOSE 90 01/12/2017   CHOL 172 08/07/2011   TRIG 123.0 08/07/2011   HDL 39.80 08/07/2011   LDLCALC 108 (H) 08/07/2011   ALT 11 11/15/2014   AST 14 11/15/2014   NA 143 01/12/2017   K 3.5 01/12/2017   CL 104 01/12/2017   CREATININE 0.83 01/12/2017   BUN 11 01/12/2017  CO2 32 01/12/2017   TSH 2.67 03/06/2015   INR 0.92 06/23/2014    No results found.  Assessment & Plan:   Cynthia BondsGloria was seen today for follow-up.  Diagnoses and all orders for this visit:  Malignant hypertension -     spironolactone (ALDACTONE) 50 MG tablet; Take 1 tablet (50 mg total) by mouth daily.   I have discontinued Cynthia Hendrix's temazepam. I have also changed her irbesartan. Additionally, I am having her start on spironolactone. Lastly, I am having her maintain her PARoxetine,  labetalol, triamterene-hydrochlorothiazide, and hydrALAZINE.  Meds ordered this encounter  Medications  . irbesartan (AVAPRO) 300 MG tablet    Sig: Take 0.5 tablets (150 mg total) by mouth daily.    Dispense:  30 tablet    Refill:  2    Order Specific Question:   Supervising Provider    Answer:   Tresa GarterPLOTNIKOV, ALEKSEI V [[id number]]  . spironolactone (ALDACTONE) 50 MG tablet    Sig: Take 1 tablet (50 mg total) by mouth daily.    Dispense:  30 tablet    Refill:  1    Order Specific Question:   Supervising Provider    Answer:   Tresa GarterPLOTNIKOV, ALEKSEI V [[id number]]    Follow-up: Return in about 1 week (around 01/28/2017) for HTN.  Alysia Pennaharlotte Laranda Burkemper, NP

## 2017-01-26 ENCOUNTER — Telehealth: Payer: Self-pay | Admitting: Nurse Practitioner

## 2017-01-26 NOTE — Telephone Encounter (Signed)
Pt called checking on her FMLA Paperwork. She said that it was faxed to the fax machine back in the clinic area on 01/22/2017. HR told her that they are needing it as soon as possible. I told her that you are both out until Thursday so unless it was seen on Friday before we closed then I was not sure whether it has been started or not.  She is suppose to return to work on Thursday, 01/28/217.

## 2017-01-28 NOTE — Telephone Encounter (Signed)
Patient called about FMLA forms. She was informed Alysia PennaCharlotte Nche has it. And that PCP was out of office. Patient states the deadline was 01/27/2017.

## 2017-01-28 NOTE — Telephone Encounter (Signed)
I do not have any FMLA papers. Maintain current instructions to return to work. Maintain upcoming appt 01/29/2017.

## 2017-01-28 NOTE — Telephone Encounter (Signed)
FMLA from [employer]. Place on [place] desk.

## 2017-01-29 ENCOUNTER — Ambulatory Visit: Payer: 59 | Admitting: Nurse Practitioner

## 2017-01-29 DIAGNOSIS — Z0279 Encounter for issue of other medical certificate: Secondary | ICD-10-CM

## 2017-01-29 NOTE — Telephone Encounter (Signed)
Form is completed, will give to pt on her appt time (today).

## 2017-02-08 NOTE — Progress Notes (Deleted)
Cardiology Office Note    Date:  02/08/2017   ID:  Cynthia Hendrix, DOB October 06, 1960, MRN [id number]  PCP:  Myrlene Broker, MD  Cardiologist: New  No chief complaint on file.   History of Present Illness:   Cynthia Hendrix is a 56 y.o. female who is being seen today for the evaluation of malignant hypertension at the request of Nche, Bonna Gains, NP. She is on Spironolactone(as of 01/21/17), paroxetine, labetalol, triamterene/HCTZ, and hydralazine.Normal renal artery duplex 2015.   Past Medical History:  Diagnosis Date  . Allergy   . Anemia   . Essential hypertension 07/08/2014  . Headache(784.0)    Pit. hemmorrhage 2008  . Hypertension    [facility] cardiology    Past Surgical History:  Procedure Laterality Date  . ABDOMINAL HYSTERECTOMY      Current Medications: No outpatient prescriptions have been marked as taking for the 02/09/17 encounter (Appointment) with Dyann Kief, PA-C.     Allergies:   Clonidine derivatives and Amlodipine besylate   Social History   Social History  . Marital status: Married    Spouse name: N/A  . Number of children: N/A  . Years of education: N/A   Social History Main Topics  . Smoking status: Former Smoker    Packs/day: 1.00    Years: 25.00    Types: Cigarettes    Quit date: 01/16/2014  . Smokeless tobacco: Never Used  . Alcohol use 1.2 oz/week    2 Cans of beer per week  . Drug use: No  . Sexual activity: Yes    Birth control/ protection: Surgical   Other Topics Concern  . Not on file   Social History Narrative  . No narrative on file     Family History:  The patient's ***family history includes Cancer in her mother; Hypertension in her other.   ROS:   Please see the history of present illness.    ROS All other systems reviewed and are negative.   PHYSICAL EXAM:   VS:  There were no vitals taken for this visit.  Physical Exam  GEN: Well nourished, well developed, in no acute distress  HEENT:  normal  Neck: no JVD, carotid bruits, or masses Cardiac:RRR; no murmurs, rubs, or gallops  Respiratory:  clear to auscultation bilaterally, normal work of breathing GI: soft, nontender, nondistended, + BS Ext: without cyanosis, clubbing, or edema, Good distal pulses bilaterally MS: no deformity or atrophy  Skin: warm and dry, no rash Neuro:  Alert and Oriented x 3, Strength and sensation are intact Psych: euthymic mood, full affect  Wt Readings from Last 3 Encounters:  01/21/17 153 lb (69.4 kg)  01/18/17 158 lb (71.7 kg)  01/12/17 154 lb (69.9 kg)      Studies/Labs Reviewed:   EKG:  EKG is*** ordered today.  The ekg ordered today demonstrates ***  Recent Labs: 01/12/2017: BUN 11; Creatinine, Ser 0.83; Potassium 3.5; Sodium 143   Lipid Panel    Component Value Date/Time   CHOL 172 08/07/2011 1505   TRIG 123.0 08/07/2011 1505   HDL 39.80 08/07/2011 1505   CHOLHDL 4 08/07/2011 1505   VLDL 24.6 08/07/2011 1505   LDLCALC 108 (H) 08/07/2011 1505    Additional studies/ records that were reviewed today include:  ***    ASSESSMENT:    No diagnosis found.   PLAN:  In order of problems listed above:      Medication Adjustments/Labs and Tests Ordered: Current medicines are  reviewed at length with the patient today.  Concerns regarding medicines are outlined above.  Medication changes, Labs and Tests ordered today are listed in the Patient Instructions below. There are no Patient Instructions on file for this visit.   Elson ClanSigned, Michele Lenze, PA-C  02/08/2017 3:08 PM    [facility] [address], [place], [place]  [address] Phone: [phone]; Fax: [phone]

## 2017-02-09 ENCOUNTER — Ambulatory Visit: Payer: 59 | Admitting: Physician Assistant

## 2017-02-11 ENCOUNTER — Encounter: Payer: Self-pay | Admitting: Physician Assistant
